# Patient Record
Sex: Female | Born: 1950 | Race: Black or African American | Hispanic: No | Smoking: Former smoker
Health system: Southern US, Community
[De-identification: ages and names within clinical notes are randomized; demographics above are authoritative.]

## PROBLEM LIST (undated history)

## (undated) DIAGNOSIS — R0981 Nasal congestion: Secondary | ICD-10-CM

## (undated) DIAGNOSIS — Z9889 Other specified postprocedural states: Secondary | ICD-10-CM

## (undated) DIAGNOSIS — F329 Major depressive disorder, single episode, unspecified: Secondary | ICD-10-CM

## (undated) DIAGNOSIS — R112 Nausea with vomiting, unspecified: Secondary | ICD-10-CM

## (undated) DIAGNOSIS — E119 Type 2 diabetes mellitus without complications: Secondary | ICD-10-CM

## (undated) DIAGNOSIS — F32A Depression, unspecified: Secondary | ICD-10-CM

## (undated) DIAGNOSIS — I10 Essential (primary) hypertension: Secondary | ICD-10-CM

## (undated) DIAGNOSIS — E785 Hyperlipidemia, unspecified: Secondary | ICD-10-CM

## (undated) DIAGNOSIS — F419 Anxiety disorder, unspecified: Secondary | ICD-10-CM

## (undated) HISTORY — PX: PILONIDAL CYST EXCISION: SHX744

---

## 1998-05-20 ENCOUNTER — Other Ambulatory Visit: Admission: RE | Admit: 1998-05-20 | Discharge: 1998-05-20 | Payer: Self-pay | Admitting: Internal Medicine

## 1999-10-15 ENCOUNTER — Observation Stay (HOSPITAL_COMMUNITY): Admission: EM | Admit: 1999-10-15 | Discharge: 1999-10-16 | Payer: Self-pay | Admitting: Emergency Medicine

## 1999-10-15 ENCOUNTER — Encounter: Payer: Self-pay | Admitting: Emergency Medicine

## 1999-10-28 ENCOUNTER — Encounter: Payer: Self-pay | Admitting: Emergency Medicine

## 1999-10-28 ENCOUNTER — Emergency Department (HOSPITAL_COMMUNITY): Admission: EM | Admit: 1999-10-28 | Discharge: 1999-10-28 | Payer: Self-pay | Admitting: Emergency Medicine

## 1999-11-21 ENCOUNTER — Encounter: Admission: RE | Admit: 1999-11-21 | Discharge: 2000-02-19 | Payer: Self-pay | Admitting: Internal Medicine

## 2000-02-14 ENCOUNTER — Encounter: Admission: RE | Admit: 2000-02-14 | Discharge: 2000-05-14 | Payer: Self-pay | Admitting: Internal Medicine

## 2000-06-21 ENCOUNTER — Other Ambulatory Visit: Admission: RE | Admit: 2000-06-21 | Discharge: 2000-06-21 | Payer: Self-pay | Admitting: Internal Medicine

## 2000-06-25 ENCOUNTER — Encounter: Admission: RE | Admit: 2000-06-25 | Discharge: 2000-06-25 | Payer: Self-pay | Admitting: Internal Medicine

## 2000-06-25 ENCOUNTER — Encounter: Payer: Self-pay | Admitting: Internal Medicine

## 2000-07-05 ENCOUNTER — Encounter: Payer: Self-pay | Admitting: Internal Medicine

## 2000-07-05 ENCOUNTER — Encounter: Admission: RE | Admit: 2000-07-05 | Discharge: 2000-07-05 | Payer: Self-pay | Admitting: Internal Medicine

## 2000-07-30 ENCOUNTER — Other Ambulatory Visit: Admission: RE | Admit: 2000-07-30 | Discharge: 2000-07-30 | Payer: Self-pay | Admitting: *Deleted

## 2000-07-30 ENCOUNTER — Encounter (INDEPENDENT_AMBULATORY_CARE_PROVIDER_SITE_OTHER): Payer: Self-pay | Admitting: Specialist

## 2001-07-08 ENCOUNTER — Encounter: Payer: Self-pay | Admitting: Internal Medicine

## 2001-07-08 ENCOUNTER — Encounter: Admission: RE | Admit: 2001-07-08 | Discharge: 2001-07-08 | Payer: Self-pay | Admitting: Internal Medicine

## 2001-12-12 ENCOUNTER — Emergency Department (HOSPITAL_COMMUNITY): Admission: EM | Admit: 2001-12-12 | Discharge: 2001-12-12 | Payer: Self-pay | Admitting: Emergency Medicine

## 2002-01-06 ENCOUNTER — Other Ambulatory Visit: Admission: RE | Admit: 2002-01-06 | Discharge: 2002-01-06 | Payer: Self-pay | Admitting: Internal Medicine

## 2002-06-24 ENCOUNTER — Encounter: Admission: RE | Admit: 2002-06-24 | Discharge: 2002-06-24 | Payer: Self-pay | Admitting: Internal Medicine

## 2002-06-24 ENCOUNTER — Encounter: Payer: Self-pay | Admitting: Internal Medicine

## 2003-03-17 ENCOUNTER — Other Ambulatory Visit: Admission: RE | Admit: 2003-03-17 | Discharge: 2003-03-17 | Payer: Self-pay | Admitting: *Deleted

## 2003-06-26 ENCOUNTER — Encounter: Payer: Self-pay | Admitting: Internal Medicine

## 2003-06-26 ENCOUNTER — Encounter: Admission: RE | Admit: 2003-06-26 | Discharge: 2003-06-26 | Payer: Self-pay | Admitting: Internal Medicine

## 2004-07-07 ENCOUNTER — Encounter: Admission: RE | Admit: 2004-07-07 | Discharge: 2004-07-07 | Payer: Self-pay | Admitting: Internal Medicine

## 2004-10-18 ENCOUNTER — Emergency Department (HOSPITAL_COMMUNITY): Admission: EM | Admit: 2004-10-18 | Discharge: 2004-10-18 | Payer: Self-pay | Admitting: Emergency Medicine

## 2005-02-08 ENCOUNTER — Encounter: Admission: RE | Admit: 2005-02-08 | Discharge: 2005-05-09 | Payer: Self-pay | Admitting: Urology

## 2005-07-10 ENCOUNTER — Encounter: Admission: RE | Admit: 2005-07-10 | Discharge: 2005-07-10 | Payer: Self-pay | Admitting: Internal Medicine

## 2006-10-31 ENCOUNTER — Encounter: Admission: RE | Admit: 2006-10-31 | Discharge: 2006-10-31 | Payer: Self-pay | Admitting: Internal Medicine

## 2006-12-28 ENCOUNTER — Emergency Department (HOSPITAL_COMMUNITY): Admission: EM | Admit: 2006-12-28 | Discharge: 2006-12-28 | Payer: Self-pay | Admitting: Emergency Medicine

## 2007-11-04 ENCOUNTER — Encounter: Admission: RE | Admit: 2007-11-04 | Discharge: 2007-11-04 | Payer: Self-pay | Admitting: Internal Medicine

## 2007-12-18 ENCOUNTER — Emergency Department (HOSPITAL_COMMUNITY): Admission: EM | Admit: 2007-12-18 | Discharge: 2007-12-18 | Payer: Self-pay | Admitting: Emergency Medicine

## 2008-09-24 ENCOUNTER — Ambulatory Visit (HOSPITAL_COMMUNITY): Admission: RE | Admit: 2008-09-24 | Discharge: 2008-09-24 | Payer: Self-pay | Admitting: Cardiology

## 2008-09-24 ENCOUNTER — Encounter (INDEPENDENT_AMBULATORY_CARE_PROVIDER_SITE_OTHER): Payer: Self-pay | Admitting: Cardiology

## 2008-09-25 ENCOUNTER — Ambulatory Visit (HOSPITAL_COMMUNITY): Admission: RE | Admit: 2008-09-25 | Discharge: 2008-09-25 | Payer: Self-pay | Admitting: Cardiology

## 2008-11-04 ENCOUNTER — Encounter: Admission: RE | Admit: 2008-11-04 | Discharge: 2008-11-04 | Payer: Self-pay | Admitting: Internal Medicine

## 2008-12-07 ENCOUNTER — Other Ambulatory Visit: Admission: RE | Admit: 2008-12-07 | Discharge: 2008-12-07 | Payer: Self-pay | Admitting: Obstetrics and Gynecology

## 2009-11-16 ENCOUNTER — Encounter: Admission: RE | Admit: 2009-11-16 | Discharge: 2009-11-16 | Payer: Self-pay | Admitting: Internal Medicine

## 2010-01-12 ENCOUNTER — Other Ambulatory Visit: Admission: RE | Admit: 2010-01-12 | Discharge: 2010-01-12 | Payer: Self-pay | Admitting: Obstetrics and Gynecology

## 2010-11-17 ENCOUNTER — Encounter: Admission: RE | Admit: 2010-11-17 | Discharge: 2010-11-17 | Payer: Self-pay | Admitting: Internal Medicine

## 2011-09-22 LAB — STREP A DNA PROBE: Group A Strep Probe: NEGATIVE

## 2011-09-22 LAB — RAPID STREP SCREEN (MED CTR MEBANE ONLY): Streptococcus, Group A Screen (Direct): NEGATIVE

## 2011-10-09 ENCOUNTER — Other Ambulatory Visit: Payer: Self-pay | Admitting: Internal Medicine

## 2011-10-09 DIAGNOSIS — Z1231 Encounter for screening mammogram for malignant neoplasm of breast: Secondary | ICD-10-CM

## 2011-11-20 ENCOUNTER — Ambulatory Visit: Payer: Self-pay

## 2011-12-20 ENCOUNTER — Ambulatory Visit: Payer: Self-pay

## 2012-01-09 ENCOUNTER — Ambulatory Visit
Admission: RE | Admit: 2012-01-09 | Discharge: 2012-01-09 | Disposition: A | Discharge status: Home / Self Care | Payer: Medicaid Other | Source: Ambulatory Visit | Attending: Internal Medicine | Admitting: Internal Medicine

## 2012-01-09 DIAGNOSIS — Z1231 Encounter for screening mammogram for malignant neoplasm of breast: Secondary | ICD-10-CM

## 2012-03-07 ENCOUNTER — Other Ambulatory Visit: Payer: Self-pay | Admitting: Nurse Practitioner

## 2012-03-07 ENCOUNTER — Other Ambulatory Visit (HOSPITAL_COMMUNITY)
Admission: RE | Admit: 2012-03-07 | Discharge: 2012-03-07 | Disposition: A | Discharge status: Home / Self Care | Payer: Medicaid Other | Source: Ambulatory Visit | Attending: Obstetrics and Gynecology | Admitting: Obstetrics and Gynecology

## 2012-03-07 DIAGNOSIS — Z01419 Encounter for gynecological examination (general) (routine) without abnormal findings: Secondary | ICD-10-CM | POA: Insufficient documentation

## 2012-03-07 DIAGNOSIS — Z1159 Encounter for screening for other viral diseases: Secondary | ICD-10-CM | POA: Insufficient documentation

## 2012-04-11 ENCOUNTER — Other Ambulatory Visit: Payer: Self-pay | Admitting: Nurse Practitioner

## 2012-05-01 ENCOUNTER — Other Ambulatory Visit: Payer: Self-pay | Admitting: Obstetrics and Gynecology

## 2012-12-26 ENCOUNTER — Other Ambulatory Visit: Payer: Self-pay | Admitting: Internal Medicine

## 2012-12-26 DIAGNOSIS — Z1231 Encounter for screening mammogram for malignant neoplasm of breast: Secondary | ICD-10-CM

## 2013-01-16 ENCOUNTER — Ambulatory Visit
Admission: RE | Admit: 2013-01-16 | Discharge: 2013-01-16 | Disposition: A | Discharge status: Home / Self Care | Payer: Medicaid Other | Source: Ambulatory Visit | Attending: Internal Medicine | Admitting: Internal Medicine

## 2013-01-16 DIAGNOSIS — Z1231 Encounter for screening mammogram for malignant neoplasm of breast: Secondary | ICD-10-CM

## 2013-09-30 ENCOUNTER — Other Ambulatory Visit: Payer: Self-pay | Admitting: Nurse Practitioner

## 2013-12-09 ENCOUNTER — Other Ambulatory Visit: Payer: Self-pay

## 2013-12-09 DIAGNOSIS — Z1231 Encounter for screening mammogram for malignant neoplasm of breast: Secondary | ICD-10-CM

## 2014-01-19 ENCOUNTER — Ambulatory Visit: Payer: Medicaid Other

## 2014-02-10 ENCOUNTER — Ambulatory Visit: Payer: Medicaid Other

## 2014-02-22 ENCOUNTER — Encounter (HOSPITAL_COMMUNITY): Payer: Self-pay | Admitting: Emergency Medicine

## 2014-02-22 ENCOUNTER — Emergency Department (HOSPITAL_COMMUNITY): Payer: Medicaid Other

## 2014-02-22 ENCOUNTER — Emergency Department (HOSPITAL_COMMUNITY)
Admission: EM | Admit: 2014-02-22 | Discharge: 2014-02-22 | Disposition: A | Discharge status: Home / Self Care | Payer: Medicaid Other | Attending: Emergency Medicine | Admitting: Emergency Medicine

## 2014-02-22 DIAGNOSIS — E119 Type 2 diabetes mellitus without complications: Secondary | ICD-10-CM | POA: Insufficient documentation

## 2014-02-22 DIAGNOSIS — Z87891 Personal history of nicotine dependence: Secondary | ICD-10-CM | POA: Insufficient documentation

## 2014-02-22 DIAGNOSIS — Y929 Unspecified place or not applicable: Secondary | ICD-10-CM | POA: Insufficient documentation

## 2014-02-22 DIAGNOSIS — S92919A Unspecified fracture of unspecified toe(s), initial encounter for closed fracture: Secondary | ICD-10-CM | POA: Insufficient documentation

## 2014-02-22 DIAGNOSIS — Y939 Activity, unspecified: Secondary | ICD-10-CM | POA: Insufficient documentation

## 2014-02-22 DIAGNOSIS — S92502A Displaced unspecified fracture of left lesser toe(s), initial encounter for closed fracture: Secondary | ICD-10-CM

## 2014-02-22 DIAGNOSIS — W2203XA Walked into furniture, initial encounter: Secondary | ICD-10-CM | POA: Insufficient documentation

## 2014-02-22 DIAGNOSIS — I10 Essential (primary) hypertension: Secondary | ICD-10-CM | POA: Insufficient documentation

## 2014-02-22 HISTORY — DX: Hyperlipidemia, unspecified: E78.5

## 2014-02-22 HISTORY — DX: Essential (primary) hypertension: I10

## 2014-02-22 HISTORY — DX: Type 2 diabetes mellitus without complications: E11.9

## 2014-02-22 MED ORDER — HYDROCODONE-ACETAMINOPHEN 5-325 MG PO TABS: 1.0000 | ORAL_TABLET | ORAL | Status: DC | PRN

## 2014-02-22 MED ORDER — ACETAMINOPHEN 325 MG PO TABS
650.0000 mg | ORAL_TABLET | Freq: Once | ORAL | Status: AC
  Administered 2014-02-22: 650 mg via ORAL
  Filled 2014-02-22: qty 2

## 2014-02-22 NOTE — ED Notes (Signed)
Pt reports she hit L pinky toes on bed and toe was stuck out to side. She stuck it back in shoe and felt it pop. Now how pain just to pinky toe. Pt ambulatory.

## 2014-02-22 NOTE — ED Provider Notes (Signed)
CSN: [id number]     Arrival date & time 02/22/14  1850 History  This chart was scribed for non-physician practitioner, Sherrie George, PA-C working with Babette Relic, MD by Frederich Balding, ED scribe. This patient was seen in room TR06C/TR06C and the patient's care was started at 7:46 PM.   Chief Complaint  Patient presents with  . Toe Injury   The history is provided by the patient. No language interpreter was used.   HPI Comments: [name] is a 63 y.o. female with history of diabetes who presents to the Emergency Department complaining of left pinky toe injury at occurred earlier today. She states she stubbed it on a wooden bed and noticed her toe was sticking out to the side. Denies hitting her head or LOC. Pt has sudden onset, aching pinky toe pain. She states she touched her toe and felt a pop in her toe. Pt states the pain started getting better after it popped. Palpation and bearing weight worsen the pain.   Past Medical History  Diagnosis Date  . Diabetes mellitus without complication   . Hypertension   . Hyperlipemia    History reviewed. No pertinent past surgical history. No family history on file. History  Substance Use Topics  . Smoking status: Former Research scientist (life sciences)  . Smokeless tobacco: Not on file  . Alcohol Use: No   OB History   Grav Para Term Preterm Abortions TAB SAB Ect Mult Living                 Review of Systems  All other systems reviewed and are negative.   Allergies  Review of patient's allergies indicates no known allergies.  Home Medications   Current Outpatient Rx  Name  Route  Sig  Dispense  Refill  . HYDROcodone-acetaminophen (NORCO) 5-325 MG per tablet   Oral   Take 1 tablet by mouth every 4 (four) hours as needed.   12 tablet   0     BP 151/60  Pulse 82  Temp(Src) 98.7 F (37.1 C) (Oral)  Resp 22  Ht 5\' 5"  (1.651 m)  Wt 229 lb (103.874 kg)  BMI 38.11 kg/m2  SpO2 95%  Physical Exam  Nursing note and vitals  reviewed. Constitutional: She is oriented to person, place, and time. She appears well-developed and well-nourished. No distress.  HENT:  Head: Normocephalic and atraumatic.  Eyes: Conjunctivae are normal.  Neck: No tracheal deviation present.  Pulmonary/Chest: Effort normal. No respiratory distress.  Musculoskeletal: Normal range of motion. She exhibits no edema.  Tender to resisted plantar flexion of left pinky toes. No swelling. No bruising. Pt able to ambulate in room. Antalgic gait. Pt states did not bother her too bad. No ulceration, abrasions, lacerations or obvious injuries.   Neurological: She is alert and oriented to person, place, and time.  Skin: Skin is warm and dry. She is not diaphoretic.  Psychiatric: She has a normal mood and affect. Her behavior is normal.    ED Course  Procedures (including critical care time)  DIAGNOSTIC STUDIES: Oxygen Saturation is 95% on RA, adequate by my interpretation.    COORDINATION OF CARE: 7:49 PM-Discussed treatment plan which includes xray with pt at bedside and pt agreed to plan.   Labs Review Labs Reviewed - No data to display Imaging Review No results found.   EKG Interpretation None      MDM   Final diagnoses:  Fracture of fifth toe, left, closed   Plain films show  Oblique fracture of the midshaft of the proximal phalanx of the left fifth toe as described. Toe was buddy taped. Patient pain treated. Plan to have patient follow up with her PCP in 2 days and monitor for any signs of infection or worsening pain to affected toe. Recommend hard soled shoes. Patient agrees with plan. Discharged in good condition.     Meds given in ED:  Medications  acetaminophen (TYLENOL) tablet 650 mg (650 mg Oral Given 02/22/14 2001)    There are no discharge medications for this patient.  I personally performed the services described in this documentation, which was scribed in my presence. The recorded information has been reviewed and  is accurate.   Sherrie George, PA-C 03/04/14 2206

## 2014-02-22 NOTE — Discharge Instructions (Signed)
Follow up with your doctor in 2 days. Take pain medication as directed. Do not drive with pain medication. Return to ED if your symptoms worsen.    Buddy Taping of Toes We have taped your toes together to keep them from moving. This is called "buddy taping" since we used a part of your own body to keep the injured part still. We placed soft padding between your toes to keep them from rubbing against each other. Buddy taping will help with healing and to reduce pain. Keep your toes buddy taped together for as long as directed by your caregiver. HOME CARE INSTRUCTIONS   Raise your injured area above the level of your heart while sitting or lying down. Prop it up with pillows.  An ice pack used every twenty minutes, while awake, for the first one to two days may be helpful. Put ice in a plastic bag and put a towel between the bag and your skin.  Watch for signs that the taping is too tight. These signs may be:  Numbness of your taped toes.  Coolness of your taped toes.  Color change in the area beyond the tape.  Increased pain.  If you have any of these signs, loosen or rewrap the tape. If you need to loosen or rewrap the buddy tape, make sure you use the padding again. SEEK IMMEDIATE MEDICAL CARE IF:   You have worse pain, swelling, inflammation (soreness), drainage or bleeding after you rewrap the tape.  Any new problems occur. MAKE SURE YOU:   Understand these instructions.  Will watch your condition.  Will get help right away if you are not doing well or get worse. Document Released: 09/07/2004 Document Revised: 02/26/2012 Document Reviewed: 12/01/2008 [facility] Patient Information 2014 [place].

## 2014-02-23 NOTE — ED Provider Notes (Signed)
Medical screening examination/treatment/procedure(s) were performed by non-physician practitioner and as supervising physician I was immediately available for consultation/collaboration.   EKG Interpretation None       Babette Relic, MD 02/23/14 [phone]

## 2014-02-27 ENCOUNTER — Ambulatory Visit
Admission: RE | Admit: 2014-02-27 | Discharge: 2014-02-27 | Disposition: A | Discharge status: Home / Self Care | Payer: Medicaid Other | Source: Ambulatory Visit

## 2014-02-27 DIAGNOSIS — Z1231 Encounter for screening mammogram for malignant neoplasm of breast: Secondary | ICD-10-CM

## 2014-03-06 NOTE — ED Provider Notes (Signed)
Medical screening examination/treatment/procedure(s) were performed by non-physician practitioner and as supervising physician I was immediately available for consultation/collaboration.   EKG Interpretation None       Babette Relic, MD 03/06/14 [phone]

## 2014-12-01 ENCOUNTER — Other Ambulatory Visit: Payer: Self-pay | Admitting: Nurse Practitioner

## 2014-12-01 ENCOUNTER — Other Ambulatory Visit (HOSPITAL_COMMUNITY)
Admission: RE | Admit: 2014-12-01 | Discharge: 2014-12-01 | Disposition: A | Discharge status: Home / Self Care | Payer: Medicare Other | Source: Ambulatory Visit | Attending: Obstetrics and Gynecology | Admitting: Obstetrics and Gynecology

## 2014-12-01 DIAGNOSIS — I10 Essential (primary) hypertension: Secondary | ICD-10-CM | POA: Diagnosis not present

## 2014-12-01 DIAGNOSIS — N858 Other specified noninflammatory disorders of uterus: Secondary | ICD-10-CM | POA: Diagnosis not present

## 2014-12-01 DIAGNOSIS — Z23 Encounter for immunization: Secondary | ICD-10-CM | POA: Diagnosis not present

## 2014-12-01 DIAGNOSIS — R938 Abnormal findings on diagnostic imaging of other specified body structures: Secondary | ICD-10-CM | POA: Diagnosis not present

## 2014-12-01 DIAGNOSIS — E119 Type 2 diabetes mellitus without complications: Secondary | ICD-10-CM | POA: Diagnosis not present

## 2014-12-01 DIAGNOSIS — Z01419 Encounter for gynecological examination (general) (routine) without abnormal findings: Secondary | ICD-10-CM | POA: Diagnosis not present

## 2014-12-01 DIAGNOSIS — Z124 Encounter for screening for malignant neoplasm of cervix: Secondary | ICD-10-CM | POA: Diagnosis not present

## 2014-12-03 LAB — CYTOLOGY - PAP

## 2015-03-01 ENCOUNTER — Other Ambulatory Visit: Payer: Self-pay

## 2015-03-01 ENCOUNTER — Ambulatory Visit
Admission: RE | Admit: 2015-03-01 | Discharge: 2015-03-01 | Disposition: A | Discharge status: Home / Self Care | Payer: Medicare Other | Source: Ambulatory Visit

## 2015-03-01 DIAGNOSIS — Z1231 Encounter for screening mammogram for malignant neoplasm of breast: Secondary | ICD-10-CM | POA: Diagnosis not present

## 2015-04-06 DIAGNOSIS — E119 Type 2 diabetes mellitus without complications: Secondary | ICD-10-CM | POA: Diagnosis not present

## 2015-04-06 DIAGNOSIS — F324 Major depressive disorder, single episode, in partial remission: Secondary | ICD-10-CM | POA: Diagnosis not present

## 2015-04-06 DIAGNOSIS — I10 Essential (primary) hypertension: Secondary | ICD-10-CM | POA: Diagnosis not present

## 2015-05-03 DIAGNOSIS — R51 Headache: Secondary | ICD-10-CM | POA: Diagnosis not present

## 2015-05-03 DIAGNOSIS — F419 Anxiety disorder, unspecified: Secondary | ICD-10-CM | POA: Diagnosis not present

## 2015-05-28 DIAGNOSIS — E119 Type 2 diabetes mellitus without complications: Secondary | ICD-10-CM | POA: Diagnosis not present

## 2015-05-28 DIAGNOSIS — H4011X2 Primary open-angle glaucoma, moderate stage: Secondary | ICD-10-CM | POA: Diagnosis not present

## 2015-05-28 DIAGNOSIS — F324 Major depressive disorder, single episode, in partial remission: Secondary | ICD-10-CM | POA: Diagnosis not present

## 2015-06-29 DIAGNOSIS — R938 Abnormal findings on diagnostic imaging of other specified body structures: Secondary | ICD-10-CM | POA: Diagnosis not present

## 2015-06-29 DIAGNOSIS — D259 Leiomyoma of uterus, unspecified: Secondary | ICD-10-CM | POA: Diagnosis not present

## 2015-07-21 DIAGNOSIS — D259 Leiomyoma of uterus, unspecified: Secondary | ICD-10-CM | POA: Diagnosis not present

## 2015-07-21 DIAGNOSIS — N9489 Other specified conditions associated with female genital organs and menstrual cycle: Secondary | ICD-10-CM | POA: Diagnosis not present

## 2015-07-21 DIAGNOSIS — R938 Abnormal findings on diagnostic imaging of other specified body structures: Secondary | ICD-10-CM | POA: Diagnosis not present

## 2015-08-24 ENCOUNTER — Other Ambulatory Visit: Payer: Self-pay | Admitting: Obstetrics and Gynecology

## 2015-08-24 NOTE — H&P (Signed)
Reason for Appointment  1. Endometrial Mass    History of Present Illness  General:  64 y/o presents for a surgery consult. she is referred by our NP Kess thongteum. she has a h/o uterine fibroids. she had an ultrasound on 06/29/2015 that showed multiple uterine fibroids. the largest is 5.1 cm... Her uterus measures 10.5 cm x 6.0 cm x 7.6 cm . of note on her ultrasound the endometrium is thickened with hyperechoic mass 2.0 cm. it is avascular. Her right ovary has a simiple cyst 2.4 cm which is avascular . her left ovary is within normal. she denies postmenopausal bleeding.  An endometrial biopsy was attempted in the office by NP Kess Thongteum. this ws unsuccessful due to fibroids.    Current Medications  Taking   Aspirin 81 MG Tablet 1 tablet Once a day   Travatan Z(Travoprost) 0.004 % Solution 1 drop into affected eye every evening Once a day   Vitamin D 1000 UNIT Capsule 1 capsule Once a day   Calcium + D3 600-200 MG-UNIT Tablet   Simvastatin 20 MG Tablet take 1 tablet by mouth once daily   Januvia(SitaGLIPtin Phosphate) 100 MG Tablet 1 tablet Once a day   MetFORMIN HCl ER 500 MG Tablet Extended Release 24 Hour TAKE 4 TABLETS BY MOUTH EVERY MORNING   Glimepiride 4 MG Tablet 1/2 tablet with breakfast or the first main meal of the day Once a day   Pioglitazone HCl 30 MG Tablet take 1 tablet by mouth once daily   Lorazepam 1 MG Tablet take 1 tablet by mouth at bedtime   Fluoxetine HCl 20 MG Capsule 1 tablet in the morning Once a day   Aldactone(Spironolactone) 25 MG Tablet 1 tablet Once a day   Not-Taking/PRN   Lisinopril-Hydrochlorothiazide 20-25 MG Tablet 1 tablet Once a day   Amlodipine Besylate 10 MG Tablet take 1 tablet by mouth once daily   Accu-Chek Aviva Plus(Blood Glucose Test) w/Device Kit use as directed   Accu-Chek Aviva Test Strips test strips use as directed once daily and if needed   Medication List reviewed and reconciled with the patient    Past Medical History  type  II diabetes  Hypertension  Obesity  Anxiety/depression  Left adnexal cyst, resolved/fibroid uterus  Glaucoma  +PPD, December 2009, INH 9 months  History of goiter  Osteopenia  Hypercalcemia   Surgical History  pilonodal cyst removal 1970   Family History  Father: deceased, Unknown  Mother: deceased 9 yrs, Hypertension, diagnosed with HTN  Paternal Grand Father: deceased  Paternal Grand Mother: deceased  Maternal Grand Father: deceased  Maternal Grand Mother: deceased  Brother 1: deceased, Alcoholism  Brother2: deceased, alcoholism  Brother 3: deceased, alcholism  Sister 1: alive, Hypertension, alcoholism,?cancer, diagnosed with HTN  Sister 2: alive  father had diabetes and hypertension in mother had palpitations but no early family history of coronary artery disease., denies any GYN family cancer hx.   Social History  General:  Tobacco use cigarettes: Former smoker, Quit in year 1980, Pack-year Hx: 10, Tobacco history last updated 07/21/2015, Additional Findings: Tobacco Non-User Ex-moderate cigarette smoker (10-19/day). no Smoking, quit. no Alcohol, no. Caffeine: yes, soda, occasionally. no Recreational drug use. Exercise: yes, hip hop tapes. Occupation: Intermittent housecleaning. Marital Status: Divorced 4 times. Children: no children offer him,1 adopted daughter. Seat belt use: yes. Divorced: yes.    Gyn History  Sexual activity not currently sexually active.  Periods : postmenopausal.  Last pap smear date 12.15.15 - WNL.  Last  mammogram date 3.14.16.  Denies H/O Abnormal pap smear.  Denies H/O STD.    OB History  miscarriages 2.  Pregnancy # 1 miscarriage.  Pregnancy # 2 miscarriage.    Allergies  Sertraline HCl: not effective  Fluoxetine: gittery, weird, sick    Hospitalization/Major Diagnostic Procedure  abscess cyst on buttocks    Review of Systems  CONSTITUTIONAL:  Fatigue none. Fever none today.  SKIN:  Rash no. Suspicious lesions no.  CARDIOLOGY:   Chest pain none.  RESPIRATORY:  Shortness of breath no. Cough no.  GASTROENTEROLOGY:  Appetite change none. Change in bowel habits no.  FEMALE REPRODUCTIVE:  Breast lumps or discharge no. Breast pain none. Dyspareunia none. Dysuria no. Pelvic pain none. Regular menses yes. Unusual vaginal discharge no. Vaginal itching no. Vulvar/labial lesion no.  NEUROLOGY:  Migraines none. Tingling/numbness none. Visual changes none.  PSYCHOLOGY:  Depression no.  ENDOCRINOLOGY:  Hot flashes none. Weight gain no unintentional. Weight loss none.  HEMATOLOGY/LYMPH:  Anemia no.              Vital Signs  Wt 226, Wt change 1.4 lb, Pulse sitting 94, BP sitting 154/80.   Physical Examination  GENERAL:  Patient appears in NAD, pleasant. Build: well developed. General Appearance: overweight. Race: african-american.  LUNGS:  Breath sounds: clear to auscultation. Dyspnea: no.  HEART:  Murmurs: systolic murmur. grade 2/6. Rate: normal. Rhythm: regular.  ABDOMEN:  General: no masses,tenderness,organomegaly, no CVAT.  FEMALE GENITOURINARY:  Adnexa: no mass, non tender. Anus/perineum: normal, no lesions. Cervix/ cuff: normal appearance , no lesions/discharge/bleeding, , good pelvic support , external os normal . External genitalia: normal, no lesions, no skin discoloration, no lymphadenopathy. Rectum: deferred. Urethra: normal external meatus. Uterus: normal size/shape/consistency, freely mobile, non tender. Vagina: pink/moist mucosa, no lesions, no abnormal discharge, odorless. Vulva: normal, no lesions, no skin discoloration, non tender.  EXTREMITIES:  Extremities FROM of all extremities.  NEUROLOGICAL:  Gait: normal. Orientation: alert and oriented x 3.     Assessments   1. Endometrial mass - N94.89 (Primary)   2. Fibroids - D25.9   3. Thickened endometrium - R93.8   Treatment  1. Endometrial mass  Notes: given inability to sample adequately in the office recommend hysteroscopy D&C with  removal of endometrial mass using myosure. ... R/B/A of surgery were discussed with the patient including but not limited to infection/ bleeding/ possible perforation of the uterus with the need for further surgery . r/o transfusion was discussed. pt desires to proceed with surgery.

## 2015-08-25 ENCOUNTER — Encounter (HOSPITAL_COMMUNITY): Payer: Self-pay

## 2015-08-25 ENCOUNTER — Encounter (HOSPITAL_COMMUNITY)
Admission: RE | Admit: 2015-08-25 | Discharge: 2015-08-25 | Disposition: A | Discharge status: Home / Self Care | Payer: Medicare Other | Source: Ambulatory Visit | Attending: Obstetrics and Gynecology | Admitting: Obstetrics and Gynecology

## 2015-08-25 ENCOUNTER — Other Ambulatory Visit: Payer: Self-pay

## 2015-08-25 DIAGNOSIS — F419 Anxiety disorder, unspecified: Secondary | ICD-10-CM | POA: Diagnosis not present

## 2015-08-25 DIAGNOSIS — E119 Type 2 diabetes mellitus without complications: Secondary | ICD-10-CM | POA: Diagnosis not present

## 2015-08-25 DIAGNOSIS — N840 Polyp of corpus uteri: Secondary | ICD-10-CM | POA: Diagnosis not present

## 2015-08-25 DIAGNOSIS — F329 Major depressive disorder, single episode, unspecified: Secondary | ICD-10-CM | POA: Diagnosis not present

## 2015-08-25 DIAGNOSIS — I10 Essential (primary) hypertension: Secondary | ICD-10-CM | POA: Diagnosis not present

## 2015-08-25 DIAGNOSIS — R0981 Nasal congestion: Secondary | ICD-10-CM

## 2015-08-25 DIAGNOSIS — Z87891 Personal history of nicotine dependence: Secondary | ICD-10-CM | POA: Diagnosis not present

## 2015-08-25 DIAGNOSIS — N9489 Other specified conditions associated with female genital organs and menstrual cycle: Secondary | ICD-10-CM | POA: Diagnosis present

## 2015-08-25 DIAGNOSIS — R011 Cardiac murmur, unspecified: Secondary | ICD-10-CM | POA: Diagnosis not present

## 2015-08-25 HISTORY — DX: Other specified postprocedural states: Z98.890

## 2015-08-25 HISTORY — DX: Anxiety disorder, unspecified: F41.9

## 2015-08-25 HISTORY — DX: Nasal congestion: R09.81

## 2015-08-25 HISTORY — DX: Other specified postprocedural states: R11.2

## 2015-08-25 HISTORY — DX: Depression, unspecified: F32.A

## 2015-08-25 HISTORY — DX: Major depressive disorder, single episode, unspecified: F32.9

## 2015-08-25 LAB — CBC
HCT: 41.7 % (ref 36.0–46.0)
Hemoglobin: 13.8 g/dL (ref 12.0–15.0)
MCH: 28.3 pg (ref 26.0–34.0)
MCHC: 33.1 g/dL (ref 30.0–36.0)
MCV: 85.5 fL (ref 78.0–100.0)
Platelets: 234 10*3/uL (ref 150–400)
RBC: 4.88 MIL/uL (ref 3.87–5.11)
RDW: 15 % (ref 11.5–15.5)
WBC: 7.7 10*3/uL (ref 4.0–10.5)

## 2015-08-25 LAB — BASIC METABOLIC PANEL
Anion gap: 9 (ref 5–15)
BUN: 8 mg/dL (ref 6–20)
CHLORIDE: 101 mmol/L (ref 101–111)
CO2: 28 mmol/L (ref 22–32)
Calcium: 9.4 mg/dL (ref 8.9–10.3)
Creatinine, Ser: 0.68 mg/dL (ref 0.44–1.00)
GFR calc Af Amer: >60 mL/min (ref >60)
GFR calc non Af Amer: >60 mL/min (ref >60)
GLUCOSE: 162 mg/dL — AB (ref 65–99)
POTASSIUM: 3.8 mmol/L (ref 3.5–5.1)
Sodium: 138 mmol/L (ref 135–145)

## 2015-08-25 NOTE — Patient Instructions (Addendum)
Your procedure is scheduled on:08/26/15 WEDNESDAY  Enter through the Main Entrance at :9am Pick up desk phone and dial [phone] and inform us of your arrival.  Please call [phone] if you have any problems the morning of surgery.  Remember: Do not eat food or drink liquids after midnight:tonight Water is  ok until:6:30 am   You may brush your teeth the morning of surgery.  Take these meds the morning of surgery with water:LORAZEPAM, FLUOXETINE,SPIRONLACTONE, LISINOPRIL-HYDROCHLOROTHIAZIDE, AMLODOPINE  DO NOT wear jewelry, eye make-up, lipstick,body lotion, or dark fingernail polish.  (Polished toes are ok) You may wear deodorant.  If you are to be admitted after surgery, leave suitcase in car until your room has been assigned. Patients discharged on the day of surgery will not be allowed to drive home. Wear loose fitting, comfortable clothes for your ride home.

## 2015-08-26 ENCOUNTER — Ambulatory Visit (HOSPITAL_COMMUNITY): Payer: Medicare Other | Admitting: Anesthesiology

## 2015-08-26 ENCOUNTER — Encounter (HOSPITAL_COMMUNITY): Payer: Self-pay | Admitting: Certified Registered Nurse Anesthetist

## 2015-08-26 ENCOUNTER — Ambulatory Visit (HOSPITAL_COMMUNITY)
Admission: RE | Admit: 2015-08-26 | Discharge: 2015-08-26 | Disposition: A | Discharge status: Home / Self Care | Payer: Medicare Other | Source: Ambulatory Visit | Attending: Obstetrics and Gynecology | Admitting: Obstetrics and Gynecology

## 2015-08-26 ENCOUNTER — Encounter (HOSPITAL_COMMUNITY)
Admission: RE | Disposition: A | Discharge status: Home / Self Care | Payer: Self-pay | Source: Ambulatory Visit | Attending: Obstetrics and Gynecology

## 2015-08-26 DIAGNOSIS — R011 Cardiac murmur, unspecified: Secondary | ICD-10-CM | POA: Diagnosis not present

## 2015-08-26 DIAGNOSIS — F329 Major depressive disorder, single episode, unspecified: Secondary | ICD-10-CM | POA: Diagnosis not present

## 2015-08-26 DIAGNOSIS — N9489 Other specified conditions associated with female genital organs and menstrual cycle: Secondary | ICD-10-CM | POA: Diagnosis not present

## 2015-08-26 DIAGNOSIS — I10 Essential (primary) hypertension: Secondary | ICD-10-CM | POA: Diagnosis not present

## 2015-08-26 DIAGNOSIS — N840 Polyp of corpus uteri: Secondary | ICD-10-CM | POA: Insufficient documentation

## 2015-08-26 DIAGNOSIS — F419 Anxiety disorder, unspecified: Secondary | ICD-10-CM | POA: Diagnosis not present

## 2015-08-26 DIAGNOSIS — E119 Type 2 diabetes mellitus without complications: Secondary | ICD-10-CM | POA: Diagnosis not present

## 2015-08-26 DIAGNOSIS — Z87891 Personal history of nicotine dependence: Secondary | ICD-10-CM | POA: Insufficient documentation

## 2015-08-26 HISTORY — PX: DILATATION & CURETTAGE/HYSTEROSCOPY WITH MYOSURE: SHX6511

## 2015-08-26 LAB — GLUCOSE, CAPILLARY: Glucose-Capillary: 147 mg/dL — ABNORMAL HIGH (ref 65–99)

## 2015-08-26 SURGERY — DILATATION & CURETTAGE/HYSTEROSCOPY WITH MYOSURE
Anesthesia: General

## 2015-08-26 MED ORDER — IBUPROFEN 600 MG PO TABS: 600.0000 mg | ORAL_TABLET | Freq: Four times a day (QID) | ORAL | Status: DC | PRN

## 2015-08-26 MED ORDER — SCOPOLAMINE 1 MG/3DAYS TD PT72
MEDICATED_PATCH | TRANSDERMAL | Status: AC
  Filled 2015-08-26: qty 1

## 2015-08-26 MED ORDER — BUPIVACAINE HCL (PF) 0.25 % IJ SOLN
INTRAMUSCULAR | Status: DC | PRN
  Administered 2015-08-26: 10 mL

## 2015-08-26 MED ORDER — LACTATED RINGERS IV SOLN: INTRAVENOUS | Status: DC

## 2015-08-26 MED ORDER — ONDANSETRON HCL 4 MG/2ML IJ SOLN
INTRAMUSCULAR | Status: AC
  Filled 2015-08-26: qty 2

## 2015-08-26 MED ORDER — ONDANSETRON HCL 4 MG/2ML IJ SOLN
INTRAMUSCULAR | Status: DC | PRN
  Administered 2015-08-26: 4 mg via INTRAVENOUS

## 2015-08-26 MED ORDER — FENTANYL CITRATE (PF) 100 MCG/2ML IJ SOLN
INTRAMUSCULAR | Status: DC | PRN
  Administered 2015-08-26 (×2): 50 ug via INTRAVENOUS

## 2015-08-26 MED ORDER — BUPIVACAINE HCL (PF) 0.25 % IJ SOLN
INTRAMUSCULAR | Status: AC
  Filled 2015-08-26: qty 30

## 2015-08-26 MED ORDER — SCOPOLAMINE 1 MG/3DAYS TD PT72
1.0000 | MEDICATED_PATCH | TRANSDERMAL | Status: DC
  Administered 2015-08-26: 1.5 mg via TRANSDERMAL

## 2015-08-26 MED ORDER — LIDOCAINE HCL (CARDIAC) 20 MG/ML IV SOLN
INTRAVENOUS | Status: DC | PRN
  Administered 2015-08-26: 40 mg via INTRAVENOUS

## 2015-08-26 MED ORDER — PROPOFOL 10 MG/ML IV BOLUS
INTRAVENOUS | Status: DC | PRN
  Administered 2015-08-26: 30 mg via INTRAVENOUS
  Administered 2015-08-26: 120 mg via INTRAVENOUS
  Administered 2015-08-26: 50 mg via INTRAVENOUS

## 2015-08-26 MED ORDER — LACTATED RINGERS IV SOLN
INTRAVENOUS | Status: DC
  Administered 2015-08-26: 10:00:00 via INTRAVENOUS

## 2015-08-26 MED ORDER — MINOCYCLINE HCL 100 MG PO CAPS
100.0000 mg | ORAL_CAPSULE | Freq: Every day | ORAL | Status: DC
Start: 2015-08-26 — End: 2016-09-03

## 2015-08-26 MED ORDER — LIDOCAINE HCL (CARDIAC) 20 MG/ML IV SOLN
INTRAVENOUS | Status: AC
  Filled 2015-08-26: qty 5

## 2015-08-26 MED ORDER — SILVER NITRATE-POT NITRATE 75-25 % EX MISC
CUTANEOUS | Status: DC | PRN
  Administered 2015-08-26: 2

## 2015-08-26 MED ORDER — DEXAMETHASONE SODIUM PHOSPHATE 10 MG/ML IJ SOLN
INTRAMUSCULAR | Status: DC | PRN
  Administered 2015-08-26: 4 mg via INTRAVENOUS

## 2015-08-26 MED ORDER — FENTANYL CITRATE (PF) 100 MCG/2ML IJ SOLN
INTRAMUSCULAR | Status: AC
  Filled 2015-08-26: qty 4

## 2015-08-26 MED ORDER — MEPERIDINE HCL 25 MG/ML IJ SOLN: 6.2500 mg | INTRAMUSCULAR | Status: DC | PRN

## 2015-08-26 MED ORDER — PROMETHAZINE HCL 25 MG/ML IJ SOLN
6.2500 mg | INTRAMUSCULAR | Status: DC | PRN
Start: 2015-08-26 — End: 2015-08-26

## 2015-08-26 MED ORDER — PROPOFOL 10 MG/ML IV BOLUS
INTRAVENOUS | Status: AC
  Filled 2015-08-26: qty 20

## 2015-08-26 MED ORDER — DEXAMETHASONE SODIUM PHOSPHATE 4 MG/ML IJ SOLN
INTRAMUSCULAR | Status: AC
  Filled 2015-08-26: qty 1

## 2015-08-26 MED ORDER — HYDROMORPHONE HCL 1 MG/ML IJ SOLN: 0.2500 mg | INTRAMUSCULAR | Status: DC | PRN

## 2015-08-26 MED ORDER — SODIUM CHLORIDE 0.9 % IR SOLN
Status: DC | PRN
  Administered 2015-08-26: 3000 mL

## 2015-08-26 SURGICAL SUPPLY — 22 items
CANISTER SUCT 3000ML (MISCELLANEOUS) ×3 IMPLANT
CATH ROBINSON RED A/P 16FR (CATHETERS) ×3 IMPLANT
CLOTH BEACON ORANGE TIMEOUT ST (SAFETY) ×3 IMPLANT
CONTAINER PREFILL 10% NBF 60ML (FORM) ×6 IMPLANT
DEVICE MYOSURE CLASSIC (MISCELLANEOUS) ×3 IMPLANT
DEVICE MYOSURE LITE (MISCELLANEOUS) IMPLANT
ELECT REM PT RETURN 9FT ADLT (ELECTROSURGICAL) ×3
ELECTRODE REM PT RTRN 9FT ADLT (ELECTROSURGICAL) ×1 IMPLANT
FILTER ARTHROSCOPY CONVERTOR (FILTER) ×3 IMPLANT
GLOVE BIOGEL M 6.5 STRL (GLOVE) ×6 IMPLANT
GLOVE BIOGEL PI IND STRL 6.5 (GLOVE) ×1 IMPLANT
GLOVE BIOGEL PI INDICATOR 6.5 (GLOVE) ×2
GOWN STRL REUS W/TWL LRG LVL3 (GOWN DISPOSABLE) ×6 IMPLANT
MYOSURE XL FIBROID REM (MISCELLANEOUS)
PACK VAGINAL MINOR WOMEN LF (CUSTOM PROCEDURE TRAY) ×3 IMPLANT
PAD OB MATERNITY 4.3X12.25 (PERSONAL CARE ITEMS) ×3 IMPLANT
SEAL ROD LENS SCOPE MYOSURE (ABLATOR) ×3 IMPLANT
SYSTEM TISS REMOVAL MYSR XL RM (MISCELLANEOUS) IMPLANT
TOWEL OR 17X24 6PK STRL BLUE (TOWEL DISPOSABLE) ×6 IMPLANT
TUBING AQUILEX INFLOW (TUBING) ×3 IMPLANT
TUBING AQUILEX OUTFLOW (TUBING) ×3 IMPLANT
WATER STERILE IRR 1000ML POUR (IV SOLUTION) ×3 IMPLANT

## 2015-08-26 NOTE — Transfer of Care (Signed)
Immediate Anesthesia Transfer of Care Note  Patient: Traci Morales  Procedure(s) Performed: Procedure(s): DILATATION & CURETTAGE/HYSTEROSCOPY, REMOVAL OF ENDOMETRIAL MASS WITH MYOSURE (N/A)  Patient Location: PACU  Anesthesia Type:General  Level of Consciousness: awake, alert , oriented and patient cooperative  Airway & Oxygen Therapy: Patient Spontanous Breathing and Patient connected to nasal cannula oxygen  Post-op Assessment: Report given to RN and Post -op Vital signs reviewed and stable  Post vital signs: Reviewed and stable  Last Vitals:  Filed Vitals:   08/26/15 0905  BP: 129/68  Pulse: 78  Temp: 37 C  Resp: 16    Complications: No apparent anesthesia complications

## 2015-08-26 NOTE — H&P (Signed)
Date of Initial H&P:08/24/2015  History reviewed, patient examined, no change in status, stable for surgery.

## 2015-08-26 NOTE — Op Note (Signed)
08/26/2015  11:31 AM  PATIENT:  Traci Morales  64 y.o. female  PRE-OPERATIVE DIAGNOSIS:  N94.89  Endometrial Mass  POST-OPERATIVE DIAGNOSIS:  N94.89  Endometrial Mass  PROCEDURE:  Procedure(s): DILATATION & CURETTAGE/HYSTEROSCOPY, REMOVAL OF ENDOMETRIAL MASS WITH MYOSURE (N/A)  SURGEON:  Surgeon(s) and Role:    * Christophe Louis, MD - Primary  PHYSICIAN ASSISTANT:   ASSISTANTS: none   ANESTHESIA:   general  EBL:  Total I/O In: 900 [I.V.:900] Out: 60 [Urine:50; Blood:5]  BLOOD ADMINISTERED:none  DRAINS: none   LOCAL MEDICATIONS USED:  MARCAINE     SPECIMEN:  Source of Specimen:  endometrial polyp and currettings  DISPOSITION OF SPECIMEN:  PATHOLOGY  COUNTS:  YES  TOURNIQUET:  * No tourniquets in log *  DICTATION: .Dragon Dictation  PLAN OF CARE: Discharge to home after PACU  PATIENT DISPOSITION:  PACU - hemodynamically stable.   Delay start of Pharmacological VTE agent (>24hrs) due to surgical blood loss or risk of bleeding: not applicable    Findings: endometrial polyp   Procedure: Patient was taken to the operating room where she was placed under general anesthesia. She was placed in the dorsal lithotomy position. She was prepped and draped in the usual sterile fashion. A speculum was placed into the vaginal vault. The anterior lip of the cervix was grasped with a single-tooth tenaculum. Quarter percent Marcaine was injected at the 4 and 8:00 positions of the cervix. The cervix was then sounded to 8 cm. The cervix was dilated to approximately 6 mm. Myosure  hysteroscope was inserted. The findings noted above.  The polyp was resected with Myosure .  A Sharp curet was introduced and endometrial curettings were obtained. The hysteroscope was then reinserted. There was no evidence of endometrial polyps or masses with reinsertion of the hysteroscope. There was no evidence of perforation. Hysteroscope was then removed. The single-tooth tenaculum was removed from the anterior  lip of the cervix.  Bleeding was noted from the tenaculum site.. Hemostasis was obtained with silver nitrate.  The speculum was removed from the patient's vagina. She was awakened from anesthesia taken care to the recovery room awake and in stable condition. Sponge lap and needle counts were correct x2.  Normal saline deficit was 175 cc.

## 2015-08-26 NOTE — Anesthesia Procedure Notes (Signed)
Procedure Name: LMA Insertion Date/Time: 08/26/2015 10:52 AM Performed by: Raenette Rover Pre-anesthesia Checklist: Patient identified, Patient being monitored, Emergency Drugs available and Suction available Patient Re-evaluated:Patient Re-evaluated prior to inductionOxygen Delivery Method: Circle system utilized Preoxygenation: Pre-oxygenation with 100% oxygen Intubation Type: IV induction Ventilation: Mask ventilation without difficulty LMA: LMA inserted LMA Size: 4.0 Number of attempts: 2 Placement Confirmation: breath sounds checked- equal and bilateral,  positive ETCO2 and CO2 detector Tube secured with: Tape Dental Injury: Teeth and Oropharynx as per pre-operative assessment

## 2015-08-26 NOTE — Anesthesia Preprocedure Evaluation (Addendum)
Anesthesia Evaluation  Patient identified by MRN, date of birth, ID band Patient awake    Reviewed: Allergy & Precautions, NPO status , Patient's Chart, lab work & pertinent test results  History of Anesthesia Complications (+) PONV  Airway Mallampati: II  TM Distance: >3 FB Neck ROM: Full    Dental  (+) Partial Upper,    Pulmonary former smoker,    breath sounds clear to auscultation       Cardiovascular hypertension, Pt. on medications  Rhythm:Regular Rate:Normal + Systolic murmurs- Carotid Bruit    Neuro/Psych PSYCHIATRIC DISORDERS Anxiety Depression    GI/Hepatic negative GI ROS, Neg liver ROS,   Endo/Other  negative endocrine ROSdiabetes, Type 2, Oral Hypoglycemic Agents  Renal/GU      Musculoskeletal negative musculoskeletal ROS (+)   Abdominal   Peds negative pediatric ROS (+)  Hematology negative hematology ROS (+)   Anesthesia Other Findings   Reproductive/Obstetrics negative OB ROS                            Lab Results  Component Value Date   WBC 7.7 08/25/2015   HGB 13.8 08/25/2015   HCT 41.7 08/25/2015   MCV 85.5 08/25/2015   PLT 234 08/25/2015   Lab Results  Component Value Date   CREATININE 0.68 08/25/2015   BUN 8 08/25/2015   NA 138 08/25/2015   K 3.8 08/25/2015   CL 101 08/25/2015   CO2 28 08/25/2015   No results found for: INR, PROTIME  Anesthesia Physical Anesthesia Plan  ASA: III  Anesthesia Plan: General   Post-op Pain Management:    Induction:   Airway Management Planned: LMA  Additional Equipment:   Intra-op Plan:   Post-operative Plan: Extubation in OR  Informed Consent: I have reviewed the patients History and Physical, chart, labs and discussed the procedure including the risks, benefits and alternatives for the proposed anesthesia with the patient or authorized representative who has indicated his/her understanding and acceptance.    Dental advisory given  Plan Discussed with: CRNA  Anesthesia Plan Comments: (Ms. Fukushima states she will go to her sister's home after she is discharged from recovery. She was notified she cannot go home after the procedure if an adult is not present to assist her. Her 66 y/o relative is not appropriate to provide care. )       Anesthesia Quick Evaluation

## 2015-08-26 NOTE — Anesthesia Postprocedure Evaluation (Signed)
  Anesthesia Post-op Note  Patient: Traci Morales  Procedure(s) Performed: Procedure(s): DILATATION & CURETTAGE/HYSTEROSCOPY, REMOVAL OF ENDOMETRIAL MASS WITH MYOSURE (N/A)  Patient Location: PACU  Anesthesia Type:General  Level of Consciousness: awake, alert  and oriented  Airway and Oxygen Therapy: Patient Spontanous Breathing  Post-op Pain: 2 /10  Post-op Assessment: Post-op Vital signs reviewed and Patient's Cardiovascular Status Stable              Post-op Vital Signs: Reviewed and stable  Last Vitals:  Filed Vitals:   08/26/15 1343  BP: 135/64  Pulse: 69  Temp:   Resp: 20    Complications: No apparent anesthesia complications

## 2015-08-27 ENCOUNTER — Encounter (HOSPITAL_COMMUNITY): Payer: Self-pay | Admitting: Obstetrics and Gynecology

## 2015-08-31 LAB — GLUCOSE, CAPILLARY: GLUCOSE-CAPILLARY: 152 mg/dL — AB (ref 65–99)

## 2015-09-07 DIAGNOSIS — Z23 Encounter for immunization: Secondary | ICD-10-CM | POA: Diagnosis not present

## 2015-09-09 DIAGNOSIS — N840 Polyp of corpus uteri: Secondary | ICD-10-CM | POA: Diagnosis not present

## 2015-11-15 DIAGNOSIS — I10 Essential (primary) hypertension: Secondary | ICD-10-CM | POA: Diagnosis not present

## 2015-11-15 DIAGNOSIS — E119 Type 2 diabetes mellitus without complications: Secondary | ICD-10-CM | POA: Diagnosis not present

## 2015-11-15 DIAGNOSIS — E669 Obesity, unspecified: Secondary | ICD-10-CM | POA: Diagnosis not present

## 2015-11-15 DIAGNOSIS — F324 Major depressive disorder, single episode, in partial remission: Secondary | ICD-10-CM | POA: Diagnosis not present

## 2015-11-15 DIAGNOSIS — Z6837 Body mass index (BMI) 37.0-37.9, adult: Secondary | ICD-10-CM | POA: Diagnosis not present

## 2015-11-15 DIAGNOSIS — M8580 Other specified disorders of bone density and structure, unspecified site: Secondary | ICD-10-CM | POA: Diagnosis not present

## 2015-11-15 DIAGNOSIS — Z1389 Encounter for screening for other disorder: Secondary | ICD-10-CM | POA: Diagnosis not present

## 2015-11-15 DIAGNOSIS — Z0000 Encounter for general adult medical examination without abnormal findings: Secondary | ICD-10-CM | POA: Diagnosis not present

## 2015-11-15 DIAGNOSIS — Z7984 Long term (current) use of oral hypoglycemic drugs: Secondary | ICD-10-CM | POA: Diagnosis not present

## 2015-11-15 DIAGNOSIS — E7800 Pure hypercholesterolemia, unspecified: Secondary | ICD-10-CM | POA: Diagnosis not present

## 2015-11-30 DIAGNOSIS — H401121 Primary open-angle glaucoma, left eye, mild stage: Secondary | ICD-10-CM | POA: Diagnosis not present

## 2015-11-30 DIAGNOSIS — E119 Type 2 diabetes mellitus without complications: Secondary | ICD-10-CM | POA: Diagnosis not present

## 2015-11-30 DIAGNOSIS — H40001 Preglaucoma, unspecified, right eye: Secondary | ICD-10-CM | POA: Diagnosis not present

## 2015-11-30 DIAGNOSIS — H25813 Combined forms of age-related cataract, bilateral: Secondary | ICD-10-CM | POA: Diagnosis not present

## 2016-01-21 DIAGNOSIS — Z0000 Encounter for general adult medical examination without abnormal findings: Secondary | ICD-10-CM | POA: Diagnosis not present

## 2016-01-21 DIAGNOSIS — Z1211 Encounter for screening for malignant neoplasm of colon: Secondary | ICD-10-CM | POA: Diagnosis not present

## 2016-01-25 ENCOUNTER — Other Ambulatory Visit: Payer: Self-pay

## 2016-01-25 DIAGNOSIS — Z1231 Encounter for screening mammogram for malignant neoplasm of breast: Secondary | ICD-10-CM

## 2016-03-02 ENCOUNTER — Ambulatory Visit: Payer: Medicare Other

## 2016-03-29 ENCOUNTER — Ambulatory Visit
Admission: RE | Admit: 2016-03-29 | Discharge: 2016-03-29 | Disposition: A | Discharge status: Home / Self Care | Payer: Medicare Other | Source: Ambulatory Visit

## 2016-03-29 DIAGNOSIS — Z1231 Encounter for screening mammogram for malignant neoplasm of breast: Secondary | ICD-10-CM | POA: Diagnosis not present

## 2016-06-06 DIAGNOSIS — H401121 Primary open-angle glaucoma, left eye, mild stage: Secondary | ICD-10-CM | POA: Diagnosis not present

## 2016-06-06 DIAGNOSIS — H40001 Preglaucoma, unspecified, right eye: Secondary | ICD-10-CM | POA: Diagnosis not present

## 2016-07-03 DIAGNOSIS — E119 Type 2 diabetes mellitus without complications: Secondary | ICD-10-CM | POA: Diagnosis not present

## 2016-07-03 DIAGNOSIS — H40001 Preglaucoma, unspecified, right eye: Secondary | ICD-10-CM | POA: Diagnosis not present

## 2016-07-03 DIAGNOSIS — H25813 Combined forms of age-related cataract, bilateral: Secondary | ICD-10-CM | POA: Diagnosis not present

## 2016-07-03 DIAGNOSIS — H401121 Primary open-angle glaucoma, left eye, mild stage: Secondary | ICD-10-CM | POA: Diagnosis not present

## 2016-07-17 ENCOUNTER — Other Ambulatory Visit: Payer: Self-pay | Admitting: Internal Medicine

## 2016-07-17 DIAGNOSIS — Z7984 Long term (current) use of oral hypoglycemic drugs: Secondary | ICD-10-CM | POA: Diagnosis not present

## 2016-07-17 DIAGNOSIS — F324 Major depressive disorder, single episode, in partial remission: Secondary | ICD-10-CM | POA: Diagnosis not present

## 2016-07-17 DIAGNOSIS — I10 Essential (primary) hypertension: Secondary | ICD-10-CM | POA: Diagnosis not present

## 2016-07-17 DIAGNOSIS — E119 Type 2 diabetes mellitus without complications: Secondary | ICD-10-CM | POA: Diagnosis not present

## 2016-07-17 DIAGNOSIS — R011 Cardiac murmur, unspecified: Secondary | ICD-10-CM | POA: Diagnosis not present

## 2016-07-24 ENCOUNTER — Ambulatory Visit (HOSPITAL_COMMUNITY): Payer: Medicare Other | Attending: Cardiovascular Disease

## 2016-07-24 ENCOUNTER — Other Ambulatory Visit: Payer: Self-pay

## 2016-07-24 DIAGNOSIS — I517 Cardiomegaly: Secondary | ICD-10-CM | POA: Insufficient documentation

## 2016-07-24 DIAGNOSIS — I350 Nonrheumatic aortic (valve) stenosis: Secondary | ICD-10-CM | POA: Diagnosis not present

## 2016-07-24 DIAGNOSIS — Z87891 Personal history of nicotine dependence: Secondary | ICD-10-CM | POA: Diagnosis not present

## 2016-07-24 DIAGNOSIS — I071 Rheumatic tricuspid insufficiency: Secondary | ICD-10-CM | POA: Diagnosis not present

## 2016-07-24 DIAGNOSIS — R011 Cardiac murmur, unspecified: Secondary | ICD-10-CM | POA: Diagnosis not present

## 2016-07-31 DIAGNOSIS — H40001 Preglaucoma, unspecified, right eye: Secondary | ICD-10-CM | POA: Diagnosis not present

## 2016-07-31 DIAGNOSIS — H25811 Combined forms of age-related cataract, right eye: Secondary | ICD-10-CM | POA: Diagnosis not present

## 2016-07-31 DIAGNOSIS — H2511 Age-related nuclear cataract, right eye: Secondary | ICD-10-CM | POA: Diagnosis not present

## 2016-08-24 DIAGNOSIS — Z23 Encounter for immunization: Secondary | ICD-10-CM | POA: Diagnosis not present

## 2016-09-03 ENCOUNTER — Encounter (HOSPITAL_COMMUNITY): Payer: Self-pay | Admitting: *Deleted

## 2016-09-03 ENCOUNTER — Emergency Department (HOSPITAL_COMMUNITY)
Admission: EM | Admit: 2016-09-03 | Discharge: 2016-09-03 | Disposition: A | Discharge status: Home / Self Care | Payer: Medicare Other | Attending: Emergency Medicine | Admitting: Emergency Medicine

## 2016-09-03 DIAGNOSIS — Z87891 Personal history of nicotine dependence: Secondary | ICD-10-CM | POA: Insufficient documentation

## 2016-09-03 DIAGNOSIS — R52 Pain, unspecified: Secondary | ICD-10-CM | POA: Diagnosis not present

## 2016-09-03 DIAGNOSIS — M791 Myalgia: Secondary | ICD-10-CM | POA: Diagnosis not present

## 2016-09-03 DIAGNOSIS — E119 Type 2 diabetes mellitus without complications: Secondary | ICD-10-CM | POA: Diagnosis not present

## 2016-09-03 DIAGNOSIS — Z7984 Long term (current) use of oral hypoglycemic drugs: Secondary | ICD-10-CM | POA: Insufficient documentation

## 2016-09-03 DIAGNOSIS — I10 Essential (primary) hypertension: Secondary | ICD-10-CM | POA: Insufficient documentation

## 2016-09-03 DIAGNOSIS — Z79899 Other long term (current) drug therapy: Secondary | ICD-10-CM | POA: Insufficient documentation

## 2016-09-03 LAB — URINALYSIS, ROUTINE W REFLEX MICROSCOPIC
BILIRUBIN URINE: NEGATIVE
Glucose, UA: NEGATIVE mg/dL
Hgb urine dipstick: NEGATIVE
KETONES UR: NEGATIVE mg/dL
Leukocytes, UA: NEGATIVE
NITRITE: NEGATIVE
Protein, ur: NEGATIVE mg/dL
Specific Gravity, Urine: 1.005 (ref 1.005–1.030)
pH: 5 (ref 5.0–8.0)

## 2016-09-03 LAB — I-STAT TROPONIN, ED: TROPONIN I, POC: 0 ng/mL (ref 0.00–0.08)

## 2016-09-03 LAB — I-STAT CHEM 8, ED
BUN: 18 mg/dL (ref 6–20)
CALCIUM ION: 1.25 mmol/L (ref 1.15–1.40)
Chloride: 108 mmol/L (ref 101–111)
Creatinine, Ser: 0.6 mg/dL (ref 0.44–1.00)
Glucose, Bld: 180 mg/dL — ABNORMAL HIGH (ref 65–99)
HCT: 43 % (ref 36.0–46.0)
HEMOGLOBIN: 14.6 g/dL (ref 12.0–15.0)
Potassium: 3.9 mmol/L (ref 3.5–5.1)
SODIUM: 141 mmol/L (ref 135–145)
TCO2: 26 mmol/L (ref 0–100)

## 2016-09-03 NOTE — ED Triage Notes (Signed)
THE PT IS C/O GENERAL BODY ACHES FOR 1-2 WEEK  WITH COLD SYMPTOMS SNEEZING CHILLS AND SWEATING

## 2016-09-03 NOTE — Discharge Instructions (Signed)
All the results in the ER are normal, labs and imaging. We are not sure what is causing your symptoms. The workup in the ER is not complete, and is limited to screening for life threatening and emergent conditions only, so please see a primary care doctor for further evaluation.  

## 2016-09-03 NOTE — ED Provider Notes (Addendum)
[facility] DEPT Provider Note   CSN: [id number] Arrival date & time: 09/03/16  0303     History   Chief Complaint Chief Complaint  Patient presents with  . Generalized Body Aches    HPI Traci Morales is a 65 y.o. female.  HPI   PT comes in with cc of feeling hot and cold and having body aches. She has been having her symptoms for 1 week. Reports intermittentlyDictation #1 [id number]  [id number]  feeling hot. Currently she feels cold however. She also has some body aches occasionally. Pt denies nausea, emesis, fevers, shaking chills, chest pains, shortness of breath, cough, headaches, neck pain or stiffness, abdominal pain, diarrhea, uti like symptoms, rash, joint aches. She has DM hx, her blood sugar was normal last night, but elevated this morning.    Past Medical History:  Diagnosis Date  . Anxiety   . Depression   . Diabetes mellitus without complication ([place])   . Hyperlipemia   . Hypertension   . Nasal congestion 08/25/15   per patient she has environmental allergies  . PONV (postoperative nausea and vomiting)    nausea only    There are no active problems to display for this patient.   Past Surgical History:  Procedure Laterality Date  . DILATATION & CURETTAGE/HYSTEROSCOPY WITH MYOSURE N/A 08/26/2015   Procedure: DILATATION & CURETTAGE/HYSTEROSCOPY, REMOVAL OF ENDOMETRIAL MASS WITH MYOSURE;  Surgeon: Christophe Louis, MD;  Location: [facility];  Service: Gynecology;  Laterality: N/A;  . PILONIDAL CYST EXCISION      OB History    No data available       Home Medications    Prior to Admission medications   Medication Sig Start Date End Date Taking? Authorizing Provider  amLODipine (NORVASC) 10 MG tablet Take 10 mg by mouth daily.  08/17/15  Yes Historical Provider, MD  dorzolamide (TRUSOPT) 2 % ophthalmic solution Place 1 drop into both eyes daily. 08/09/15  Yes Historical Provider, MD  fluocinonide (LIDEX) 0.05 % external solution Apply 1 application  topically once a week. 07/08/15  Yes Historical Provider, MD  FLUoxetine (PROZAC) 20 MG capsule Take 1 capsule by mouth daily. 08/19/15  Yes Historical Provider, MD  glimepiride (AMARYL) 4 MG tablet Take 2 mg by mouth daily. 08/19/15  Yes Historical Provider, MD  JANUVIA 100 MG tablet Take 1 tablet by mouth daily. 07/29/15  Yes Historical Provider, MD  lisinopril-hydrochlorothiazide (PRINZIDE,ZESTORETIC) 20-25 MG per tablet Take 1 tablet by mouth daily. 07/21/15  Yes Historical Provider, MD  LORazepam (ATIVAN) 1 MG tablet Take 1 mg by mouth at bedtime. 07/30/15  Yes Historical Provider, MD  metFORMIN (GLUCOPHAGE-XR) 500 MG 24 hr tablet Take 4 tablets by mouth daily. Pt prefers to take all 4 tablets at same time 08/02/15  Yes Historical Provider, MD  pioglitazone (ACTOS) 30 MG tablet Take 1 tablet by mouth daily. 07/29/15  Yes Historical Provider, MD  simvastatin (ZOCOR) 20 MG tablet Take 1 tablet by mouth daily. 07/29/15  Yes Historical Provider, MD  spironolactone (ALDACTONE) 25 MG tablet Take 1 tablet by mouth daily. 08/19/15  Yes Historical Provider, MD  TRAVATAN Z 0.004 % SOLN ophthalmic solution Place 1 drop into both eyes at bedtime. 08/09/15  Yes Historical Provider, MD    Family History No family history on file.  Social History Social History  Substance Use Topics  . Smoking status: Former Research scientist (life sciences)  . Smokeless tobacco: Never Used  . Alcohol use No     Allergies   Review of patient's  allergies indicates no known allergies.   Review of Systems Review of Systems  ROS 10 Systems reviewed and are negative for acute change except as noted in the HPI.     Physical Exam Updated Vital Signs BP (!) 164/119 (BP Location: Left Arm)   Pulse 101   Temp 98.4 F (36.9 C) (Oral)   Resp 20   Ht 5\' 5"  (1.651 m)   Wt 220 lb (99.8 kg)   SpO2 100%   BMI 36.61 kg/m   Physical Exam  Constitutional: She is oriented to person, place, and time. She appears well-developed.  HENT:  Head:  Normocephalic and atraumatic.  Eyes: EOM are normal.  Neck: Normal range of motion. Neck supple.  Cardiovascular: Normal rate.   Pulmonary/Chest: Effort normal.  Abdominal: Bowel sounds are normal.  Neurological: She is alert and oriented to person, place, and time.  Skin: Skin is warm and dry.  Nursing note and vitals reviewed.    ED Treatments / Results  Labs (all labs ordered are listed, but only abnormal results are displayed) Labs Reviewed  I-STAT CHEM 8, ED - Abnormal; Notable for the following:       Result Value   Glucose, Bld 180 (*)    All other components within normal limits  URINALYSIS, ROUTINE W REFLEX MICROSCOPIC (NOT AT [facility])  I-STAT TROPOININ, ED  CBG MONITORING, ED    EKG  EKG Interpretation None       Radiology No results found.  Procedures Procedures (including critical care time)  Medications Ordered in ED Medications - No data to display   Initial Impression / Assessment and Plan / ED Course  I have reviewed the triage vital signs and the nursing notes.  Pertinent labs & imaging results that were available during my care of the patient were reviewed by me and considered in my medical decision making (see chart for details).  Clinical Course   Pt with very non specific complains. We will check UA and electrolytes. WE will get troponin and EKG. ROS is widely negative besides what's mentioned in the HPI.  Final Clinical Impressions(s) / ED Diagnoses   Final diagnoses:  Body aches    New Prescriptions New Prescriptions   No medications on file     Varney Biles, MD 09/03/16 [place], MD 09/03/16 [phone]

## 2016-09-03 NOTE — ED Notes (Signed)
Pt ambulatory w/ steady gait to restroom. 

## 2016-10-10 DIAGNOSIS — H43811 Vitreous degeneration, right eye: Secondary | ICD-10-CM | POA: Diagnosis not present

## 2016-10-10 DIAGNOSIS — H401121 Primary open-angle glaucoma, left eye, mild stage: Secondary | ICD-10-CM | POA: Diagnosis not present

## 2016-10-10 DIAGNOSIS — E113293 Type 2 diabetes mellitus with mild nonproliferative diabetic retinopathy without macular edema, bilateral: Secondary | ICD-10-CM | POA: Diagnosis not present

## 2016-12-26 ENCOUNTER — Other Ambulatory Visit (HOSPITAL_COMMUNITY)
Admission: RE | Admit: 2016-12-26 | Discharge: 2016-12-26 | Disposition: A | Discharge status: Home / Self Care | Payer: Medicare Other | Source: Ambulatory Visit | Attending: Obstetrics and Gynecology | Admitting: Obstetrics and Gynecology

## 2016-12-26 ENCOUNTER — Other Ambulatory Visit: Payer: Self-pay | Admitting: Obstetrics and Gynecology

## 2016-12-26 DIAGNOSIS — Z1151 Encounter for screening for human papillomavirus (HPV): Secondary | ICD-10-CM | POA: Insufficient documentation

## 2016-12-26 DIAGNOSIS — Z01419 Encounter for gynecological examination (general) (routine) without abnormal findings: Secondary | ICD-10-CM | POA: Insufficient documentation

## 2016-12-26 DIAGNOSIS — Z7251 High risk heterosexual behavior: Secondary | ICD-10-CM | POA: Diagnosis not present

## 2016-12-26 DIAGNOSIS — Z9189 Other specified personal risk factors, not elsewhere classified: Secondary | ICD-10-CM | POA: Diagnosis not present

## 2016-12-28 LAB — CYTOLOGY - PAP
DIAGNOSIS: NEGATIVE
HPV (WINDOPATH): NOT DETECTED

## 2017-01-22 DIAGNOSIS — F324 Major depressive disorder, single episode, in partial remission: Secondary | ICD-10-CM | POA: Diagnosis not present

## 2017-01-22 DIAGNOSIS — E113293 Type 2 diabetes mellitus with mild nonproliferative diabetic retinopathy without macular edema, bilateral: Secondary | ICD-10-CM | POA: Diagnosis not present

## 2017-01-22 DIAGNOSIS — Z6838 Body mass index (BMI) 38.0-38.9, adult: Secondary | ICD-10-CM | POA: Diagnosis not present

## 2017-01-22 DIAGNOSIS — M8580 Other specified disorders of bone density and structure, unspecified site: Secondary | ICD-10-CM | POA: Diagnosis not present

## 2017-01-22 DIAGNOSIS — I10 Essential (primary) hypertension: Secondary | ICD-10-CM | POA: Diagnosis not present

## 2017-01-22 DIAGNOSIS — Z0000 Encounter for general adult medical examination without abnormal findings: Secondary | ICD-10-CM | POA: Diagnosis not present

## 2017-01-22 DIAGNOSIS — E7800 Pure hypercholesterolemia, unspecified: Secondary | ICD-10-CM | POA: Diagnosis not present

## 2017-01-22 DIAGNOSIS — Z7984 Long term (current) use of oral hypoglycemic drugs: Secondary | ICD-10-CM | POA: Diagnosis not present

## 2017-01-22 DIAGNOSIS — E669 Obesity, unspecified: Secondary | ICD-10-CM | POA: Diagnosis not present

## 2017-02-01 DIAGNOSIS — Z1211 Encounter for screening for malignant neoplasm of colon: Secondary | ICD-10-CM | POA: Diagnosis not present

## 2017-02-02 DIAGNOSIS — H25812 Combined forms of age-related cataract, left eye: Secondary | ICD-10-CM | POA: Diagnosis not present

## 2017-02-02 DIAGNOSIS — H40001 Preglaucoma, unspecified, right eye: Secondary | ICD-10-CM | POA: Diagnosis not present

## 2017-02-02 DIAGNOSIS — E119 Type 2 diabetes mellitus without complications: Secondary | ICD-10-CM | POA: Diagnosis not present

## 2017-02-02 DIAGNOSIS — H401121 Primary open-angle glaucoma, left eye, mild stage: Secondary | ICD-10-CM | POA: Diagnosis not present

## 2017-03-15 DIAGNOSIS — M8588 Other specified disorders of bone density and structure, other site: Secondary | ICD-10-CM | POA: Diagnosis not present

## 2017-03-15 DIAGNOSIS — M810 Age-related osteoporosis without current pathological fracture: Secondary | ICD-10-CM | POA: Diagnosis not present

## 2017-03-22 ENCOUNTER — Other Ambulatory Visit: Payer: Self-pay | Admitting: Internal Medicine

## 2017-03-22 DIAGNOSIS — Z1231 Encounter for screening mammogram for malignant neoplasm of breast: Secondary | ICD-10-CM

## 2017-04-12 ENCOUNTER — Ambulatory Visit
Admission: RE | Admit: 2017-04-12 | Discharge: 2017-04-12 | Disposition: A | Discharge status: Home / Self Care | Payer: Medicare Other | Source: Ambulatory Visit | Attending: Internal Medicine | Admitting: Internal Medicine

## 2017-04-12 DIAGNOSIS — Z1231 Encounter for screening mammogram for malignant neoplasm of breast: Secondary | ICD-10-CM

## 2017-04-13 ENCOUNTER — Ambulatory Visit: Payer: Medicare Other

## 2017-04-15 ENCOUNTER — Emergency Department (HOSPITAL_COMMUNITY)
Admission: EM | Admit: 2017-04-15 | Discharge: 2017-04-15 | Disposition: A | Discharge status: Home / Self Care | Payer: Medicare Other | Attending: Emergency Medicine | Admitting: Emergency Medicine

## 2017-04-15 ENCOUNTER — Encounter (HOSPITAL_COMMUNITY): Payer: Self-pay

## 2017-04-15 ENCOUNTER — Emergency Department (HOSPITAL_COMMUNITY): Payer: Medicare Other

## 2017-04-15 DIAGNOSIS — W010XXA Fall on same level from slipping, tripping and stumbling without subsequent striking against object, initial encounter: Secondary | ICD-10-CM | POA: Insufficient documentation

## 2017-04-15 DIAGNOSIS — M25561 Pain in right knee: Secondary | ICD-10-CM | POA: Diagnosis not present

## 2017-04-15 DIAGNOSIS — M7989 Other specified soft tissue disorders: Secondary | ICD-10-CM | POA: Insufficient documentation

## 2017-04-15 DIAGNOSIS — S8992XA Unspecified injury of left lower leg, initial encounter: Secondary | ICD-10-CM | POA: Diagnosis present

## 2017-04-15 DIAGNOSIS — Y929 Unspecified place or not applicable: Secondary | ICD-10-CM | POA: Insufficient documentation

## 2017-04-15 DIAGNOSIS — M25562 Pain in left knee: Secondary | ICD-10-CM | POA: Diagnosis not present

## 2017-04-15 DIAGNOSIS — Y939 Activity, unspecified: Secondary | ICD-10-CM | POA: Insufficient documentation

## 2017-04-15 DIAGNOSIS — Z87891 Personal history of nicotine dependence: Secondary | ICD-10-CM | POA: Diagnosis not present

## 2017-04-15 DIAGNOSIS — Z7984 Long term (current) use of oral hypoglycemic drugs: Secondary | ICD-10-CM | POA: Insufficient documentation

## 2017-04-15 DIAGNOSIS — Y999 Unspecified external cause status: Secondary | ICD-10-CM | POA: Insufficient documentation

## 2017-04-15 DIAGNOSIS — I10 Essential (primary) hypertension: Secondary | ICD-10-CM | POA: Insufficient documentation

## 2017-04-15 DIAGNOSIS — E119 Type 2 diabetes mellitus without complications: Secondary | ICD-10-CM | POA: Diagnosis not present

## 2017-04-15 DIAGNOSIS — M25462 Effusion, left knee: Secondary | ICD-10-CM | POA: Diagnosis not present

## 2017-04-15 DIAGNOSIS — W19XXXA Unspecified fall, initial encounter: Secondary | ICD-10-CM

## 2017-04-15 DIAGNOSIS — M79644 Pain in right finger(s): Secondary | ICD-10-CM | POA: Diagnosis not present

## 2017-04-15 NOTE — ED Notes (Signed)
Returned from xray

## 2017-04-15 NOTE — ED Triage Notes (Signed)
Patient complains of bilateral knee discomfort with swelling after tripping over parking obstacle this am. Alert and oriented, also small abrasion to right hand

## 2017-04-15 NOTE — Discharge Instructions (Signed)
As discussed, follow the rice protocol provided in this packet. Follow up with your primary care provider.   Return if symptoms worsen, you experience numbness or any new concerning symptoms in the meantime.

## 2017-04-15 NOTE — ED Provider Notes (Signed)
[facility] DEPT Provider Note   CSN: [id number] Arrival date & time: 04/15/17  1046  By signing my name below, I, Dora Sims, attest that this documentation has been prepared under the direction and in the presence of Avie Echevaria, PA-C. Electronically Signed: Dora Sims, Scribe. 04/15/2017. 11:35 AM.  History   Chief Complaint Chief Complaint  Patient presents with  . Knee Pain   The history is provided by the patient. No language interpreter was used.    HPI Comments: Traci Morales is a 66 y.o. female with PMHx including DM, HTN, and HLD who presents to the Emergency Department complaining of constant, mild bilateral knee pain s/p a mechanical fall that occurred about 40 minutes ago. She states she tripped over an object and struck her bilateral knees on concrete. No head trauma or LOC. She reports associated swelling. She states her pain is worse in her left knee and is exacerbated by weight bearing and applied pressure to her knees. Patient also notes some mild swelling to her right pinky finger as a result of the fall with some aching pain that is worse with flexion of the finger. No medications or treatments tried PTA. She denies numbness/tingling, focal weakness, wounds, or any other associated symptoms.  Past Medical History:  Diagnosis Date  . Anxiety   . Depression   . Diabetes mellitus without complication ([place])   . Hyperlipemia   . Hypertension   . Nasal congestion 08/25/15   per patient she has environmental allergies  . PONV (postoperative nausea and vomiting)    nausea only    There are no active problems to display for this patient.   Past Surgical History:  Procedure Laterality Date  . DILATATION & CURETTAGE/HYSTEROSCOPY WITH MYOSURE N/A 08/26/2015   Procedure: DILATATION & CURETTAGE/HYSTEROSCOPY, REMOVAL OF ENDOMETRIAL MASS WITH MYOSURE;  Surgeon: Christophe Louis, MD;  Location: [facility];  Service: Gynecology;  Laterality: N/A;  . PILONIDAL CYST EXCISION       OB History    No data available       Home Medications    Prior to Admission medications   Medication Sig Start Date End Date Taking? Authorizing Provider  amLODipine (NORVASC) 10 MG tablet Take 10 mg by mouth daily.  08/17/15   Historical Provider, MD  dorzolamide (TRUSOPT) 2 % ophthalmic solution Place 1 drop into both eyes daily. 08/09/15   Historical Provider, MD  fluocinonide (LIDEX) 0.05 % external solution Apply 1 application topically once a week. 07/08/15   Historical Provider, MD  FLUoxetine (PROZAC) 20 MG capsule Take 1 capsule by mouth daily. 08/19/15   Historical Provider, MD  glimepiride (AMARYL) 4 MG tablet Take 2 mg by mouth daily. 08/19/15   Historical Provider, MD  JANUVIA 100 MG tablet Take 1 tablet by mouth daily. 07/29/15   Historical Provider, MD  lisinopril-hydrochlorothiazide (PRINZIDE,ZESTORETIC) 20-25 MG per tablet Take 1 tablet by mouth daily. 07/21/15   Historical Provider, MD  LORazepam (ATIVAN) 1 MG tablet Take 1 mg by mouth at bedtime. 07/30/15   Historical Provider, MD  metFORMIN (GLUCOPHAGE-XR) 500 MG 24 hr tablet Take 4 tablets by mouth daily. Pt prefers to take all 4 tablets at same time 08/02/15   Historical Provider, MD  pioglitazone (ACTOS) 30 MG tablet Take 1 tablet by mouth daily. 07/29/15   Historical Provider, MD  simvastatin (ZOCOR) 20 MG tablet Take 1 tablet by mouth daily. 07/29/15   Historical Provider, MD  spironolactone (ALDACTONE) 25 MG tablet Take 1 tablet by mouth  daily. 08/19/15   Historical Provider, MD  TRAVATAN Z 0.004 % SOLN ophthalmic solution Place 1 drop into both eyes at bedtime. 08/09/15   Historical Provider, MD    Family History No family history on file.  Social History Social History  Substance Use Topics  . Smoking status: Former Research scientist (life sciences)  . Smokeless tobacco: Never Used  . Alcohol use No     Allergies   Patient has no known allergies.   Review of Systems Review of Systems  Musculoskeletal: Positive for arthralgias and  joint swelling.  Skin: Negative for wound.  Neurological: Negative for syncope, weakness and numbness.   Physical Exam Updated Vital Signs BP (!) 130/53   Pulse 72   Temp 98.1 F (36.7 C) (Oral)   Resp 16   Ht 5\' 4"  (1.626 m)   Wt 102.1 kg   SpO2 100%   BMI 38.62 kg/m   Physical Exam  Constitutional: She is oriented to person, place, and time. She appears well-developed and well-nourished. No distress.  Patient is afebrile, non-toxic appearing, sitting comfortably in chair in no acute distress.  HENT:  Head: Normocephalic and atraumatic.  Eyes: Conjunctivae and EOM are normal.  Neck: Neck supple. No tracheal deviation present.  Cardiovascular: Normal rate.   Pulmonary/Chest: Effort normal. No respiratory distress.  Musculoskeletal: Normal range of motion.  Negative anterior drawer test bilaterally. Mild swelling to the medial left knee. Mild swelling to the PIP of the right pinky finger and pain with movement of this digit.  Neurological: She is alert and oriented to person, place, and time.  Neurovascularly intact distally. Normal gait. 5/5 strength in lower extremites bilaterally. Strong grips, 5/5 strength with resisted flexion at the dip and pip  Skin: Skin is warm and dry.  Psychiatric: She has a normal mood and affect. Her behavior is normal.  Nursing note and vitals reviewed.  ED Treatments / Results  Labs (all labs ordered are listed, but only abnormal results are displayed) Labs Reviewed - No data to display  EKG  EKG Interpretation None       Radiology Dg Knee Complete 4 Views Left  Result Date: 04/15/2017 CLINICAL DATA:  [place] yesterday from standing onto both knees; Left knee is tender to palpation; Right little finger is swollen and painful from same fall EXAM: LEFT KNEE - COMPLETE 4+ VIEW COMPARISON:  None. FINDINGS: No fracture.  No bone lesion. There is mild sclerosis and irregularity along the dorsal margin of the patella. This appears degenerative in  origin. There is mild medial compartment narrowing with small medial marginal osteophytes. No other arthropathic change. No joint effusion. Soft tissues are unremarkable. IMPRESSION: No fracture or acute finding. Electronically Signed   By: Lajean Manes M.D.   On: 04/15/2017 12:54   Dg Finger Little Right  Result Date: 04/15/2017 CLINICAL DATA:  [place] yesterday from standing onto both knees; Left knee is tender to palpation; Right little finger is swollen and painful from same fall EXAM: RIGHT LITTLE FINGER 2+V COMPARISON:  None. FINDINGS: No fracture or dislocation. The PIP joint is held in flexion with mild extension at the DIP joint. This is suspicious for a tendon injury. Mild soft tissue swelling is seen adjacent to the PIP joint. IMPRESSION: 1. No fracture or dislocation. 2. Flexion deformity at the PIP joint of the right fifth finger. 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 Electronically Signed   By: Lajean Manes M.D.   On: 04/15/2017 12:57    Procedures Procedures (  including critical care time)  DIAGNOSTIC STUDIES: Oxygen Saturation is 100% on RA, normal by my interpretation.    COORDINATION OF CARE: 11:42 AM Discussed treatment plan with pt at bedside and pt agreed to plan.  Medications Ordered in ED Medications - No data to display   Initial Impression / Assessment and Plan / ED Course  I have reviewed the triage vital signs and the nursing notes.  Pertinent labs & imaging results that were available during my care of the patient were reviewed by me and considered in my medical decision making (see chart for details).      Patient X-Ray negative for obvious fracture or dislocation. Reassuring exam. Pt advised to follow up with Primary care provider. Patient given sleeve while in ED, conservative therapy recommended and discussed. Patient will be discharged home & is agreeable with above plan. Returns precautions discussed. Pt appears safe for  discharge.  Discussed strict return precautions and advised to return to the emergency department if experiencing any new or worsening symptoms. Instructions were understood and patient agreed with discharge plan.  Final Clinical Impressions(s) / ED Diagnoses   Final diagnoses:  Fall, initial encounter    New Prescriptions Discharge Medication List as of 04/15/2017  1:37 PM     I personally performed the services described in this documentation, which was scribed in my presence. The recorded information has been reviewed and is accurate.    Emeline General, PA-C 04/15/17 [place], DO 04/18/17 1951

## 2017-04-15 NOTE — ED Notes (Signed)
Patient transported to X-ray 

## 2017-04-17 DIAGNOSIS — M25562 Pain in left knee: Secondary | ICD-10-CM | POA: Diagnosis not present

## 2017-08-07 DIAGNOSIS — H25812 Combined forms of age-related cataract, left eye: Secondary | ICD-10-CM | POA: Diagnosis not present

## 2017-08-07 DIAGNOSIS — H40001 Preglaucoma, unspecified, right eye: Secondary | ICD-10-CM | POA: Diagnosis not present

## 2017-08-07 DIAGNOSIS — H401121 Primary open-angle glaucoma, left eye, mild stage: Secondary | ICD-10-CM | POA: Diagnosis not present

## 2017-08-21 DIAGNOSIS — Z23 Encounter for immunization: Secondary | ICD-10-CM | POA: Diagnosis not present

## 2017-09-14 DIAGNOSIS — Z7984 Long term (current) use of oral hypoglycemic drugs: Secondary | ICD-10-CM | POA: Diagnosis not present

## 2017-09-14 DIAGNOSIS — E119 Type 2 diabetes mellitus without complications: Secondary | ICD-10-CM | POA: Diagnosis not present

## 2017-09-14 DIAGNOSIS — I10 Essential (primary) hypertension: Secondary | ICD-10-CM | POA: Diagnosis not present

## 2017-11-15 DIAGNOSIS — H2512 Age-related nuclear cataract, left eye: Secondary | ICD-10-CM | POA: Diagnosis not present

## 2017-11-15 DIAGNOSIS — Z961 Presence of intraocular lens: Secondary | ICD-10-CM | POA: Diagnosis not present

## 2017-11-15 DIAGNOSIS — H401121 Primary open-angle glaucoma, left eye, mild stage: Secondary | ICD-10-CM | POA: Diagnosis not present

## 2017-11-15 DIAGNOSIS — H401111 Primary open-angle glaucoma, right eye, mild stage: Secondary | ICD-10-CM | POA: Diagnosis not present

## 2017-12-31 DIAGNOSIS — H2512 Age-related nuclear cataract, left eye: Secondary | ICD-10-CM | POA: Diagnosis not present

## 2017-12-31 DIAGNOSIS — H401121 Primary open-angle glaucoma, left eye, mild stage: Secondary | ICD-10-CM | POA: Diagnosis not present

## 2017-12-31 DIAGNOSIS — H409 Unspecified glaucoma: Secondary | ICD-10-CM | POA: Diagnosis not present

## 2017-12-31 DIAGNOSIS — H25812 Combined forms of age-related cataract, left eye: Secondary | ICD-10-CM | POA: Diagnosis not present

## 2018-01-22 DIAGNOSIS — H401121 Primary open-angle glaucoma, left eye, mild stage: Secondary | ICD-10-CM | POA: Diagnosis not present

## 2018-01-22 DIAGNOSIS — H2512 Age-related nuclear cataract, left eye: Secondary | ICD-10-CM | POA: Diagnosis not present

## 2018-01-24 DIAGNOSIS — E7800 Pure hypercholesterolemia, unspecified: Secondary | ICD-10-CM | POA: Diagnosis not present

## 2018-01-24 DIAGNOSIS — E119 Type 2 diabetes mellitus without complications: Secondary | ICD-10-CM | POA: Diagnosis not present

## 2018-01-24 DIAGNOSIS — S60149A Contusion of unspecified ring finger with damage to nail, initial encounter: Secondary | ICD-10-CM | POA: Diagnosis not present

## 2018-01-29 DIAGNOSIS — E119 Type 2 diabetes mellitus without complications: Secondary | ICD-10-CM | POA: Diagnosis not present

## 2018-01-29 DIAGNOSIS — I10 Essential (primary) hypertension: Secondary | ICD-10-CM | POA: Diagnosis not present

## 2018-01-29 DIAGNOSIS — G4700 Insomnia, unspecified: Secondary | ICD-10-CM | POA: Diagnosis not present

## 2018-01-29 DIAGNOSIS — Z1389 Encounter for screening for other disorder: Secondary | ICD-10-CM | POA: Diagnosis not present

## 2018-01-29 DIAGNOSIS — F324 Major depressive disorder, single episode, in partial remission: Secondary | ICD-10-CM | POA: Diagnosis not present

## 2018-01-29 DIAGNOSIS — Z0000 Encounter for general adult medical examination without abnormal findings: Secondary | ICD-10-CM | POA: Diagnosis not present

## 2018-02-07 DIAGNOSIS — Z7251 High risk heterosexual behavior: Secondary | ICD-10-CM | POA: Diagnosis not present

## 2018-02-07 DIAGNOSIS — N764 Abscess of vulva: Secondary | ICD-10-CM | POA: Diagnosis not present

## 2018-02-07 DIAGNOSIS — Z9189 Other specified personal risk factors, not elsewhere classified: Secondary | ICD-10-CM | POA: Diagnosis not present

## 2018-03-05 IMAGING — CR DG KNEE COMPLETE 4+V*L*
4 series · 4 of 4 positions shown · non-contrast
Comparison: None.

CLINICAL DATA: Fell yesterday from standing onto both knees; Left
knee is tender to palpation; Right little finger is swollen and
painful from same fall

EXAM:
LEFT KNEE - COMPLETE 4+ VIEW

[knee ap]
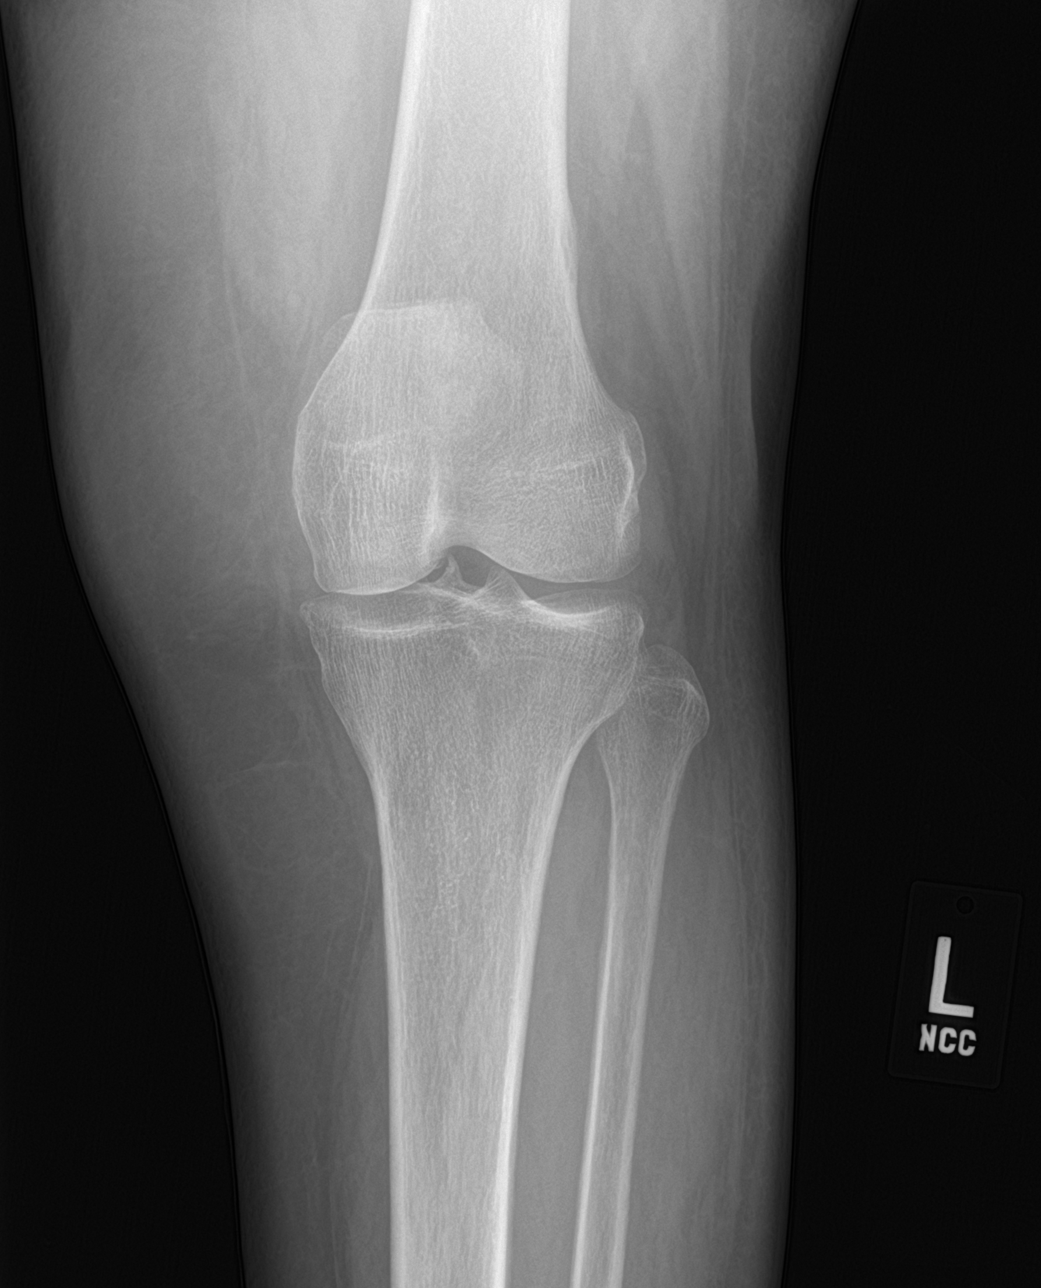

[knee lat]
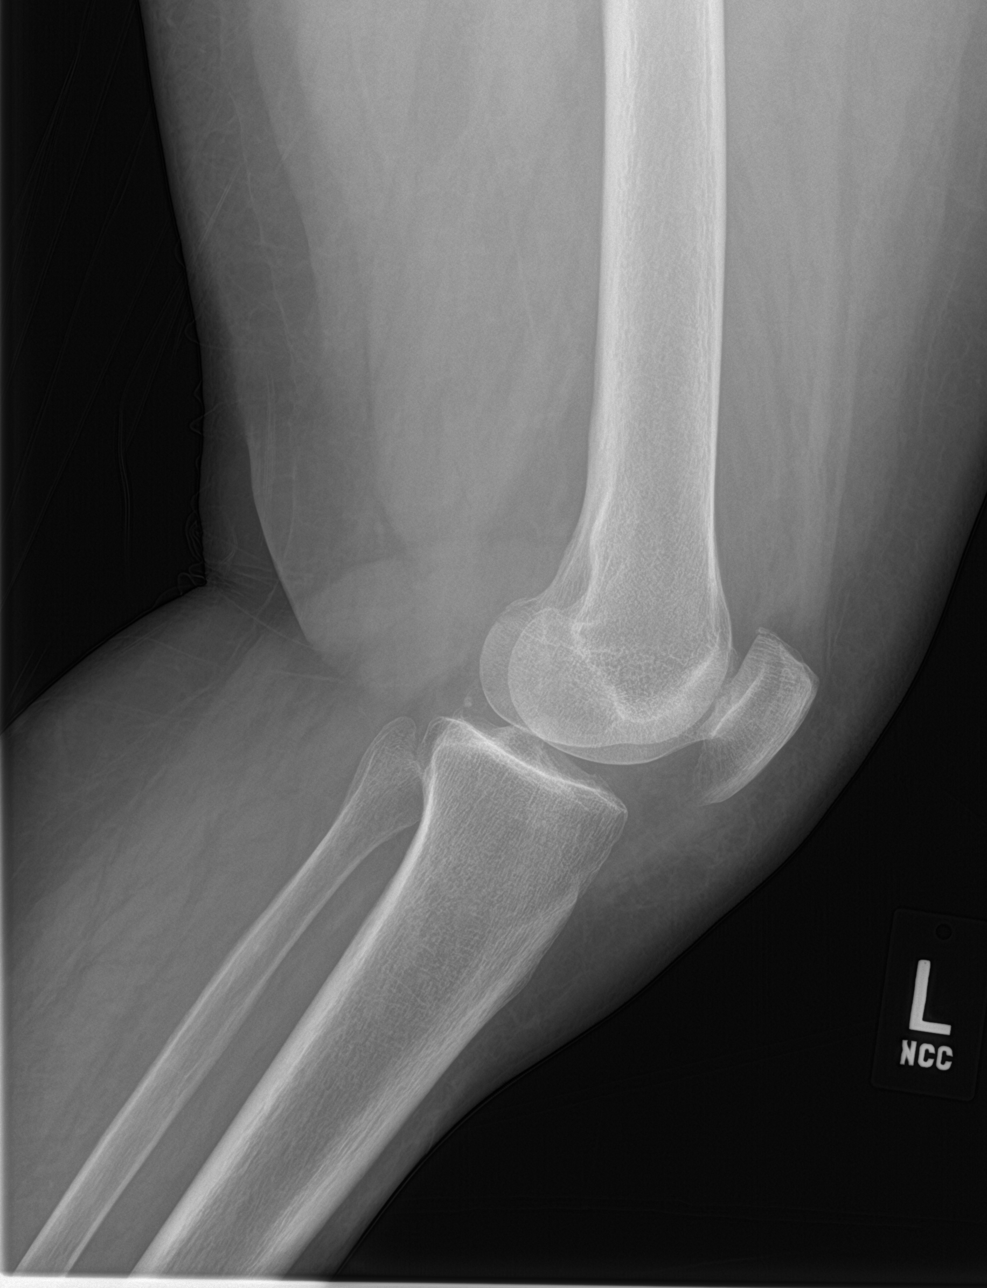

[knee obl (1 of 2)]
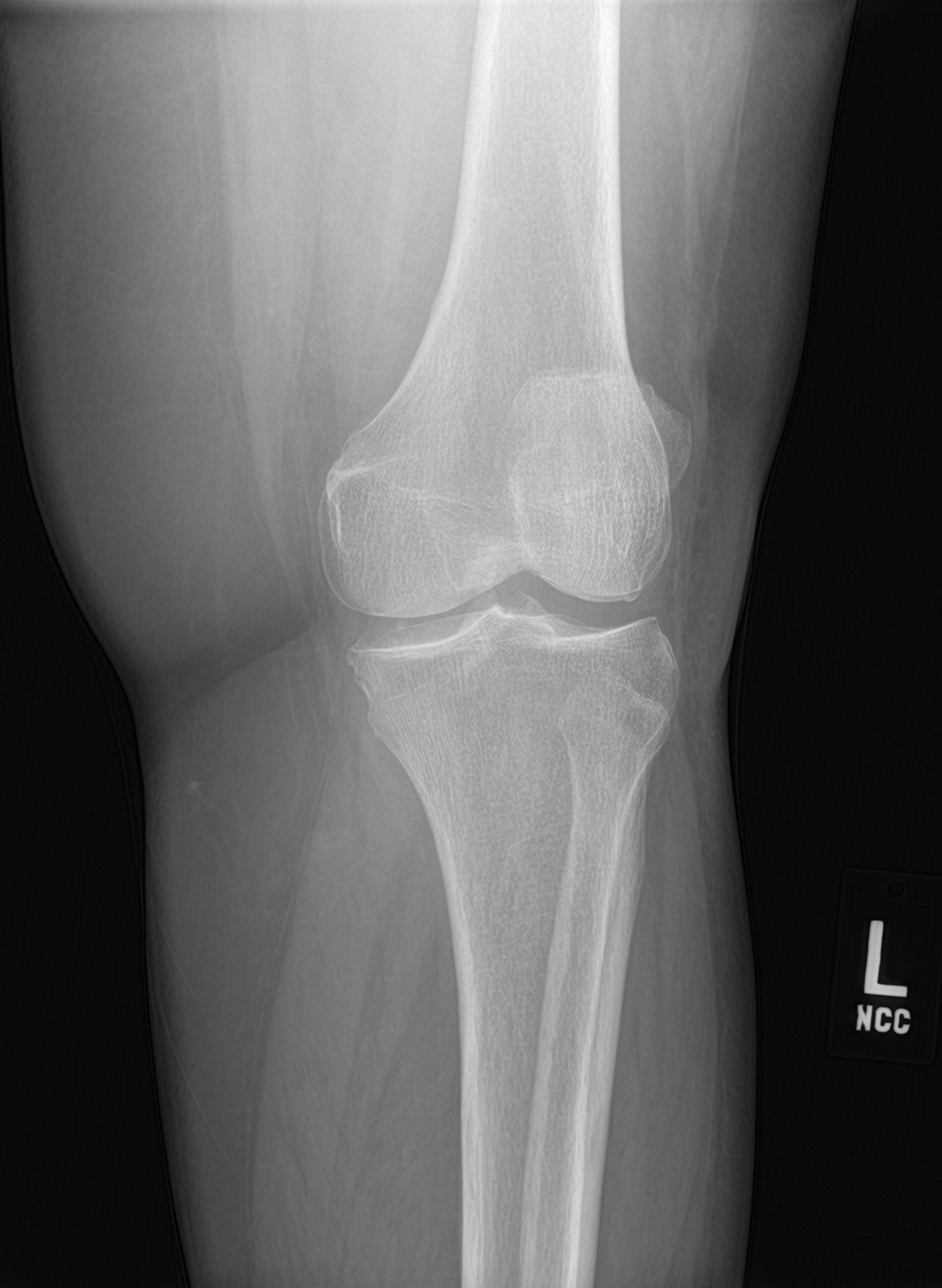

[knee obl (2 of 2)]
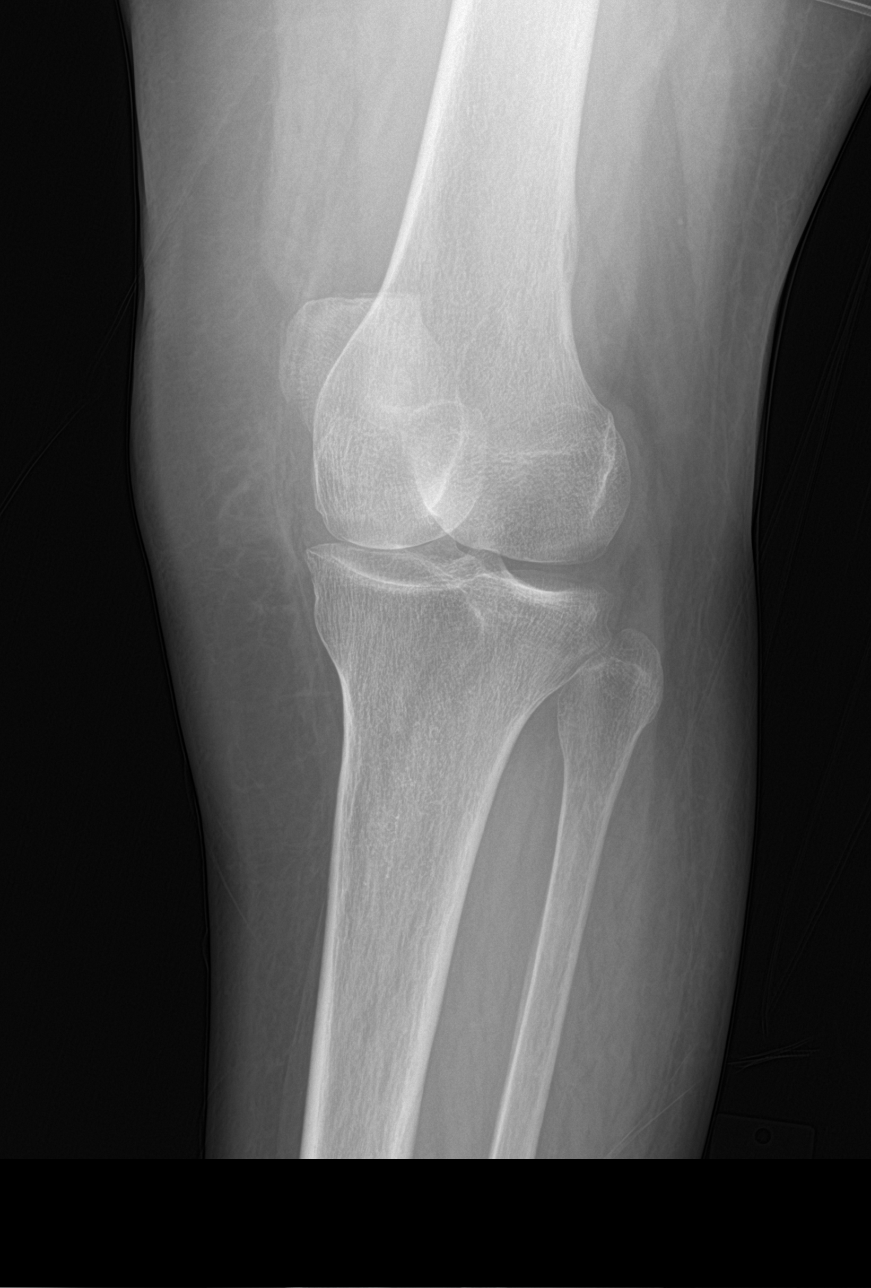

[4 of 4 positions shown; findings below may reference images not displayed]

FINDINGS: No fracture.  No bone lesion.

There is mild sclerosis and irregularity along the dorsal margin of
the patella. This appears degenerative in origin. There is mild
medial compartment narrowing with small medial marginal osteophytes.
No other arthropathic change.

No joint effusion.

Soft tissues are unremarkable.
IMPRESSION: No fracture or acute finding.

## 2018-03-25 ENCOUNTER — Other Ambulatory Visit: Payer: Self-pay | Admitting: Internal Medicine

## 2018-03-25 DIAGNOSIS — Z1231 Encounter for screening mammogram for malignant neoplasm of breast: Secondary | ICD-10-CM

## 2018-04-18 ENCOUNTER — Ambulatory Visit
Admission: RE | Admit: 2018-04-18 | Discharge: 2018-04-18 | Disposition: A | Discharge status: Home / Self Care | Payer: Medicare Other | Source: Ambulatory Visit | Attending: Internal Medicine | Admitting: Internal Medicine

## 2018-04-18 DIAGNOSIS — Z1231 Encounter for screening mammogram for malignant neoplasm of breast: Secondary | ICD-10-CM

## 2018-04-29 DIAGNOSIS — H26491 Other secondary cataract, right eye: Secondary | ICD-10-CM | POA: Diagnosis not present

## 2018-05-20 DIAGNOSIS — S0501XA Injury of conjunctiva and corneal abrasion without foreign body, right eye, initial encounter: Secondary | ICD-10-CM | POA: Diagnosis not present

## 2018-05-23 DIAGNOSIS — S0501XD Injury of conjunctiva and corneal abrasion without foreign body, right eye, subsequent encounter: Secondary | ICD-10-CM | POA: Diagnosis not present

## 2018-07-10 DIAGNOSIS — Z7984 Long term (current) use of oral hypoglycemic drugs: Secondary | ICD-10-CM | POA: Diagnosis not present

## 2018-07-10 DIAGNOSIS — F324 Major depressive disorder, single episode, in partial remission: Secondary | ICD-10-CM | POA: Diagnosis not present

## 2018-07-10 DIAGNOSIS — E113293 Type 2 diabetes mellitus with mild nonproliferative diabetic retinopathy without macular edema, bilateral: Secondary | ICD-10-CM | POA: Diagnosis not present

## 2018-07-10 DIAGNOSIS — K5900 Constipation, unspecified: Secondary | ICD-10-CM | POA: Diagnosis not present

## 2018-07-10 DIAGNOSIS — E1165 Type 2 diabetes mellitus with hyperglycemia: Secondary | ICD-10-CM | POA: Diagnosis not present

## 2018-07-10 DIAGNOSIS — I10 Essential (primary) hypertension: Secondary | ICD-10-CM | POA: Diagnosis not present

## 2018-08-15 DIAGNOSIS — H401131 Primary open-angle glaucoma, bilateral, mild stage: Secondary | ICD-10-CM | POA: Diagnosis not present

## 2018-08-16 DIAGNOSIS — I10 Essential (primary) hypertension: Secondary | ICD-10-CM | POA: Diagnosis not present

## 2018-08-16 DIAGNOSIS — E119 Type 2 diabetes mellitus without complications: Secondary | ICD-10-CM | POA: Diagnosis not present

## 2018-08-16 DIAGNOSIS — Z7984 Long term (current) use of oral hypoglycemic drugs: Secondary | ICD-10-CM | POA: Diagnosis not present

## 2018-08-16 DIAGNOSIS — F324 Major depressive disorder, single episode, in partial remission: Secondary | ICD-10-CM | POA: Diagnosis not present

## 2018-08-16 DIAGNOSIS — H409 Unspecified glaucoma: Secondary | ICD-10-CM | POA: Diagnosis not present

## 2018-09-11 DIAGNOSIS — Z23 Encounter for immunization: Secondary | ICD-10-CM | POA: Diagnosis not present

## 2018-09-11 DIAGNOSIS — E1169 Type 2 diabetes mellitus with other specified complication: Secondary | ICD-10-CM | POA: Diagnosis not present

## 2018-09-11 DIAGNOSIS — F324 Major depressive disorder, single episode, in partial remission: Secondary | ICD-10-CM | POA: Diagnosis not present

## 2018-09-11 DIAGNOSIS — I10 Essential (primary) hypertension: Secondary | ICD-10-CM | POA: Diagnosis not present

## 2018-09-11 DIAGNOSIS — H409 Unspecified glaucoma: Secondary | ICD-10-CM | POA: Diagnosis not present

## 2018-10-03 DIAGNOSIS — E119 Type 2 diabetes mellitus without complications: Secondary | ICD-10-CM | POA: Diagnosis not present

## 2018-10-03 DIAGNOSIS — E1169 Type 2 diabetes mellitus with other specified complication: Secondary | ICD-10-CM | POA: Diagnosis not present

## 2018-10-03 DIAGNOSIS — F324 Major depressive disorder, single episode, in partial remission: Secondary | ICD-10-CM | POA: Diagnosis not present

## 2018-10-03 DIAGNOSIS — H409 Unspecified glaucoma: Secondary | ICD-10-CM | POA: Diagnosis not present

## 2018-10-03 DIAGNOSIS — I10 Essential (primary) hypertension: Secondary | ICD-10-CM | POA: Diagnosis not present

## 2018-10-12 ENCOUNTER — Ambulatory Visit (HOSPITAL_COMMUNITY)
Admission: EM | Admit: 2018-10-12 | Discharge: 2018-10-12 | Disposition: A | Discharge status: Home / Self Care | Payer: Medicare Other | Attending: Family Medicine | Admitting: Family Medicine

## 2018-10-12 ENCOUNTER — Encounter (HOSPITAL_COMMUNITY): Payer: Self-pay | Admitting: Emergency Medicine

## 2018-10-12 ENCOUNTER — Other Ambulatory Visit: Payer: Self-pay

## 2018-10-12 DIAGNOSIS — J029 Acute pharyngitis, unspecified: Secondary | ICD-10-CM | POA: Diagnosis not present

## 2018-10-12 MED ORDER — CETIRIZINE HCL 5 MG PO TABS: 5.0000 mg | ORAL_TABLET | Freq: Every day | ORAL | 0 refills | Status: AC

## 2018-10-12 MED ORDER — IPRATROPIUM BROMIDE 0.06 % NA SOLN: 2.0000 | Freq: Four times a day (QID) | NASAL | 0 refills | Status: AC

## 2018-10-12 MED ORDER — LIDOCAINE VISCOUS HCL 2 % MT SOLN: OROMUCOSAL | 0 refills | Status: AC

## 2018-10-12 NOTE — Discharge Instructions (Signed)
Symptoms are most likely due to viral illness/ drainage down your throat. Start lidocaine for sore throat, do not eat or drink for the next 40 mins after use as it can stunt your gag reflex. Atrovent, Zyrtec for nasal congestion/drainage. You can use over the counter nasal saline rinse such as neti pot for nasal congestion. Monitor for any worsening of symptoms, swelling of the throat, trouble breathing, trouble swallowing, leaning forward to breath, drooling, go to the emergency department for further evaluation needed.  For sore throat/cough try using a honey-based tea. Use 3 teaspoons of honey with juice squeezed from half lemon. Place shaved pieces of ginger into 1/2-1 cup of water and warm over stove top. Then mix the ingredients and repeat every 4 hours as needed.

## 2018-10-12 NOTE — ED Provider Notes (Signed)
[facility]    CSN: [id number] Arrival date & time: 10/12/18  1311     History   Chief Complaint Chief Complaint  Patient presents with  . Sore Throat    HPI Traci Morales is a 67 y.o. female.   67 year old female comes in for 1 week history of sore throat. States has been gradually worsening through the week. Sneezing with rhinorrhea. No nasal congestion, cough. Denies fever, chills, night sweats. Hot beverages without relief. Former smoker.      Past Medical History:  Diagnosis Date  . Anxiety   . Depression   . Diabetes mellitus without complication ([place])   . Hyperlipemia   . Hypertension   . Nasal congestion 08/25/15   per patient she has environmental allergies  . PONV (postoperative nausea and vomiting)    nausea only    There are no active problems to display for this patient.   Past Surgical History:  Procedure Laterality Date  . DILATATION & CURETTAGE/HYSTEROSCOPY WITH MYOSURE N/A 08/26/2015   Procedure: DILATATION & CURETTAGE/HYSTEROSCOPY, REMOVAL OF ENDOMETRIAL MASS WITH MYOSURE;  Surgeon: Christophe Louis, MD;  Location: [facility];  Service: Gynecology;  Laterality: N/A;  . PILONIDAL CYST EXCISION      OB History   None      Home Medications    Prior to Admission medications   Medication Sig Start Date End Date Taking? Authorizing Provider  amLODipine (NORVASC) 10 MG tablet Take 10 mg by mouth daily.  08/17/15  Yes [provider]  dorzolamide (TRUSOPT) 2 % ophthalmic solution Place 1 drop into both eyes daily. 08/09/15  Yes [provider]  fluocinonide (LIDEX) 0.05 % external solution Apply 1 application topically once a week. 07/08/15  Yes [provider]  FLUoxetine (PROZAC) 20 MG capsule Take 1 capsule by mouth daily. 08/19/15  Yes [provider]  glimepiride (AMARYL) 4 MG tablet Take 2 mg by mouth daily. 08/19/15  Yes [provider]  JANUVIA 100 MG tablet Take 1 tablet by mouth daily. 07/29/15  Yes  [provider]  lisinopril-hydrochlorothiazide (PRINZIDE,ZESTORETIC) 20-25 MG per tablet Take 1 tablet by mouth daily. 07/21/15  Yes [provider]  LORazepam (ATIVAN) 1 MG tablet Take 1 mg by mouth at bedtime. 07/30/15  Yes [provider]  metFORMIN (GLUCOPHAGE-XR) 500 MG 24 hr tablet Take 4 tablets by mouth daily. Pt prefers to take all 4 tablets at same time 08/02/15  Yes [provider]  simvastatin (ZOCOR) 20 MG tablet Take 1 tablet by mouth daily. 07/29/15  Yes [provider]  spironolactone (ALDACTONE) 25 MG tablet Take 1 tablet by mouth daily. 08/19/15  Yes [provider]  TRAVATAN Z 0.004 % SOLN ophthalmic solution Place 1 drop into both eyes at bedtime. 08/09/15  Yes [provider]  cetirizine (ZYRTEC) 5 MG tablet Take 1 tablet (5 mg total) by mouth daily. 10/12/18   Tasia Catchings, Amy V, PA-C  ipratropium (ATROVENT) 0.06 % nasal spray Place 2 sprays into both nostrils 4 (four) times daily. 10/12/18   Tasia Catchings, Amy V, PA-C  lidocaine (XYLOCAINE) 2 % solution 5-15 mL gurgle as needed 10/12/18   Ok Edwards, PA-C    Family History History reviewed. No pertinent family history.  Social History Social History   Tobacco Use  . Smoking status: Former Research scientist (life sciences)  . Smokeless tobacco: Never Used  Substance Use Topics  . Alcohol use: No  . Drug use: No     Allergies  Patient has no known allergies.   Review of Systems Review of Systems  Reason unable to perform ROS: See HPI as above.     Physical Exam Triage Vital Signs ED Triage Vitals  Enc Vitals Group     BP 10/12/18 1350 (!) 158/62     Pulse Rate 10/12/18 1350 79     Resp 10/12/18 1350 18     Temp 10/12/18 1350 98 F (36.7 C)     Temp Source 10/12/18 1350 Oral     SpO2 10/12/18 1350 100 %     Weight --      Height --      Head Circumference --      Peak Flow --      Pain Score 10/12/18 1348 0     Pain Loc --      Pain Edu? --      Excl. in [place]? --    No data  found.  Updated Vital Signs BP (!) 158/62 (BP Location: Right Arm)   Pulse 79   Temp 98 F (36.7 C) (Oral)   Resp 18   SpO2 100%   Physical Exam  Constitutional: She is oriented to person, place, and time. She appears well-developed and well-nourished.  Non-toxic appearance. She does not appear ill. No distress.  HENT:  Head: Normocephalic and atraumatic.  Right Ear: Tympanic membrane, external ear and ear canal normal. Tympanic membrane is not erythematous and not bulging.  Left Ear: Tympanic membrane, external ear and ear canal normal. Tympanic membrane is not erythematous and not bulging.  Nose: Nose normal. Right sinus exhibits no maxillary sinus tenderness and no frontal sinus tenderness. Left sinus exhibits no maxillary sinus tenderness and no frontal sinus tenderness.  Mouth/Throat: Uvula is midline, oropharynx is clear and moist and mucous membranes are normal. No tonsillar exudate.  Eyes: Pupils are equal, round, and reactive to light. Conjunctivae are normal.  Neck: Normal range of motion. Neck supple.  Cardiovascular: Normal rate, regular rhythm and normal heart sounds. Exam reveals no gallop and no friction rub.  No murmur heard. Pulmonary/Chest: Effort normal and breath sounds normal. No respiratory distress. She has no decreased breath sounds. She has no wheezes. She has no rhonchi. She has no rales.  Lymphadenopathy:    She has no cervical adenopathy.  Neurological: She is alert and oriented to person, place, and time.  Skin: Skin is warm and dry.  Psychiatric: She has a normal mood and affect. Her behavior is normal. Judgment normal.     UC Treatments / Results  Labs (all labs ordered are listed, but only abnormal results are displayed) Labs Reviewed - No data to display  EKG None  Radiology No results found.  Procedures Procedures (including critical care time)  Medications Ordered in UC Medications - No data to display  Initial Impression / Assessment  and Plan / UC Course  I have reviewed the triage vital signs and the nursing notes.  Pertinent labs & imaging results that were available during my care of the patient were reviewed by me and considered in my medical decision making (see chart for details).    Exam unremarkable. Discussed symptoms more likely due to allergies/post nasal drip. Offered rapid strep, patient would like to defer for now. Symptomatic treatment. Push fluids. Return precautions given.  Final Clinical Impressions(s) / UC Diagnoses   Final diagnoses:  Pharyngitis, unspecified etiology    ED Prescriptions    Medication Sig Dispense Auth. Provider   cetirizine (ZYRTEC)  5 MG tablet Take 1 tablet (5 mg total) by mouth daily. 15 tablet Yu, Amy V, PA-C   ipratropium (ATROVENT) 0.06 % nasal spray Place 2 sprays into both nostrils 4 (four) times daily. 15 mL Yu, Amy V, PA-C   lidocaine (XYLOCAINE) 2 % solution 5-15 mL gurgle as needed 150 mL Tobin Chad, [place] 10/12/18 1459

## 2018-10-12 NOTE — ED Triage Notes (Signed)
The patient presented to the [facility] with a complaint of a sore throat x 1 week. The patient denied any known fever.

## 2018-10-31 DIAGNOSIS — I10 Essential (primary) hypertension: Secondary | ICD-10-CM | POA: Diagnosis not present

## 2018-10-31 DIAGNOSIS — H409 Unspecified glaucoma: Secondary | ICD-10-CM | POA: Diagnosis not present

## 2018-10-31 DIAGNOSIS — F324 Major depressive disorder, single episode, in partial remission: Secondary | ICD-10-CM | POA: Diagnosis not present

## 2018-10-31 DIAGNOSIS — E119 Type 2 diabetes mellitus without complications: Secondary | ICD-10-CM | POA: Diagnosis not present

## 2018-10-31 DIAGNOSIS — Z7984 Long term (current) use of oral hypoglycemic drugs: Secondary | ICD-10-CM | POA: Diagnosis not present

## 2018-10-31 DIAGNOSIS — E1169 Type 2 diabetes mellitus with other specified complication: Secondary | ICD-10-CM | POA: Diagnosis not present

## 2018-11-19 DIAGNOSIS — H401131 Primary open-angle glaucoma, bilateral, mild stage: Secondary | ICD-10-CM | POA: Diagnosis not present

## 2018-12-03 DIAGNOSIS — H04123 Dry eye syndrome of bilateral lacrimal glands: Secondary | ICD-10-CM | POA: Diagnosis not present

## 2018-12-09 DIAGNOSIS — I10 Essential (primary) hypertension: Secondary | ICD-10-CM | POA: Diagnosis not present

## 2018-12-09 DIAGNOSIS — F324 Major depressive disorder, single episode, in partial remission: Secondary | ICD-10-CM | POA: Diagnosis not present

## 2018-12-09 DIAGNOSIS — E1169 Type 2 diabetes mellitus with other specified complication: Secondary | ICD-10-CM | POA: Diagnosis not present

## 2018-12-16 DIAGNOSIS — E119 Type 2 diabetes mellitus without complications: Secondary | ICD-10-CM | POA: Diagnosis not present

## 2018-12-16 DIAGNOSIS — E1169 Type 2 diabetes mellitus with other specified complication: Secondary | ICD-10-CM | POA: Diagnosis not present

## 2018-12-16 DIAGNOSIS — I10 Essential (primary) hypertension: Secondary | ICD-10-CM | POA: Diagnosis not present

## 2018-12-16 DIAGNOSIS — F324 Major depressive disorder, single episode, in partial remission: Secondary | ICD-10-CM | POA: Diagnosis not present

## 2018-12-16 DIAGNOSIS — H409 Unspecified glaucoma: Secondary | ICD-10-CM | POA: Diagnosis not present

## 2019-01-16 DIAGNOSIS — F324 Major depressive disorder, single episode, in partial remission: Secondary | ICD-10-CM | POA: Diagnosis not present

## 2019-01-16 DIAGNOSIS — E1169 Type 2 diabetes mellitus with other specified complication: Secondary | ICD-10-CM | POA: Diagnosis not present

## 2019-01-16 DIAGNOSIS — Z7984 Long term (current) use of oral hypoglycemic drugs: Secondary | ICD-10-CM | POA: Diagnosis not present

## 2019-01-16 DIAGNOSIS — I10 Essential (primary) hypertension: Secondary | ICD-10-CM | POA: Diagnosis not present

## 2019-01-16 DIAGNOSIS — E119 Type 2 diabetes mellitus without complications: Secondary | ICD-10-CM | POA: Diagnosis not present

## 2019-01-16 DIAGNOSIS — H409 Unspecified glaucoma: Secondary | ICD-10-CM | POA: Diagnosis not present

## 2019-02-10 ENCOUNTER — Other Ambulatory Visit: Payer: Self-pay | Admitting: Internal Medicine

## 2019-02-11 DIAGNOSIS — H409 Unspecified glaucoma: Secondary | ICD-10-CM | POA: Diagnosis not present

## 2019-02-11 DIAGNOSIS — E1169 Type 2 diabetes mellitus with other specified complication: Secondary | ICD-10-CM | POA: Diagnosis not present

## 2019-02-11 DIAGNOSIS — I10 Essential (primary) hypertension: Secondary | ICD-10-CM | POA: Diagnosis not present

## 2019-02-11 DIAGNOSIS — E119 Type 2 diabetes mellitus without complications: Secondary | ICD-10-CM | POA: Diagnosis not present

## 2019-02-11 DIAGNOSIS — F324 Major depressive disorder, single episode, in partial remission: Secondary | ICD-10-CM | POA: Diagnosis not present

## 2019-02-11 DIAGNOSIS — Z7984 Long term (current) use of oral hypoglycemic drugs: Secondary | ICD-10-CM | POA: Diagnosis not present

## 2019-02-18 DIAGNOSIS — H401131 Primary open-angle glaucoma, bilateral, mild stage: Secondary | ICD-10-CM | POA: Diagnosis not present

## 2019-03-06 DIAGNOSIS — E119 Type 2 diabetes mellitus without complications: Secondary | ICD-10-CM | POA: Diagnosis not present

## 2019-03-06 DIAGNOSIS — E1169 Type 2 diabetes mellitus with other specified complication: Secondary | ICD-10-CM | POA: Diagnosis not present

## 2019-03-06 DIAGNOSIS — Z7984 Long term (current) use of oral hypoglycemic drugs: Secondary | ICD-10-CM | POA: Diagnosis not present

## 2019-03-06 DIAGNOSIS — I10 Essential (primary) hypertension: Secondary | ICD-10-CM | POA: Diagnosis not present

## 2019-03-06 DIAGNOSIS — F324 Major depressive disorder, single episode, in partial remission: Secondary | ICD-10-CM | POA: Diagnosis not present

## 2019-03-06 DIAGNOSIS — H409 Unspecified glaucoma: Secondary | ICD-10-CM | POA: Diagnosis not present

## 2019-04-09 DIAGNOSIS — E1169 Type 2 diabetes mellitus with other specified complication: Secondary | ICD-10-CM | POA: Diagnosis not present

## 2019-04-09 DIAGNOSIS — E7800 Pure hypercholesterolemia, unspecified: Secondary | ICD-10-CM | POA: Diagnosis not present

## 2019-04-09 DIAGNOSIS — M810 Age-related osteoporosis without current pathological fracture: Secondary | ICD-10-CM | POA: Diagnosis not present

## 2019-04-09 DIAGNOSIS — I10 Essential (primary) hypertension: Secondary | ICD-10-CM | POA: Diagnosis not present

## 2019-04-09 DIAGNOSIS — Z0000 Encounter for general adult medical examination without abnormal findings: Secondary | ICD-10-CM | POA: Diagnosis not present

## 2019-04-09 DIAGNOSIS — Z1389 Encounter for screening for other disorder: Secondary | ICD-10-CM | POA: Diagnosis not present

## 2019-04-09 DIAGNOSIS — E669 Obesity, unspecified: Secondary | ICD-10-CM | POA: Diagnosis not present

## 2019-04-09 DIAGNOSIS — F324 Major depressive disorder, single episode, in partial remission: Secondary | ICD-10-CM | POA: Diagnosis not present

## 2019-04-22 DIAGNOSIS — Z7984 Long term (current) use of oral hypoglycemic drugs: Secondary | ICD-10-CM | POA: Diagnosis not present

## 2019-04-22 DIAGNOSIS — E1169 Type 2 diabetes mellitus with other specified complication: Secondary | ICD-10-CM | POA: Diagnosis not present

## 2019-04-22 DIAGNOSIS — E7800 Pure hypercholesterolemia, unspecified: Secondary | ICD-10-CM | POA: Diagnosis not present

## 2019-05-16 DIAGNOSIS — Z7984 Long term (current) use of oral hypoglycemic drugs: Secondary | ICD-10-CM | POA: Diagnosis not present

## 2019-05-16 DIAGNOSIS — F324 Major depressive disorder, single episode, in partial remission: Secondary | ICD-10-CM | POA: Diagnosis not present

## 2019-05-16 DIAGNOSIS — E7800 Pure hypercholesterolemia, unspecified: Secondary | ICD-10-CM | POA: Diagnosis not present

## 2019-05-16 DIAGNOSIS — H409 Unspecified glaucoma: Secondary | ICD-10-CM | POA: Diagnosis not present

## 2019-05-16 DIAGNOSIS — E1169 Type 2 diabetes mellitus with other specified complication: Secondary | ICD-10-CM | POA: Diagnosis not present

## 2019-05-16 DIAGNOSIS — M8580 Other specified disorders of bone density and structure, unspecified site: Secondary | ICD-10-CM | POA: Diagnosis not present

## 2019-05-16 DIAGNOSIS — M810 Age-related osteoporosis without current pathological fracture: Secondary | ICD-10-CM | POA: Diagnosis not present

## 2019-05-16 DIAGNOSIS — I10 Essential (primary) hypertension: Secondary | ICD-10-CM | POA: Diagnosis not present

## 2019-05-23 ENCOUNTER — Other Ambulatory Visit: Payer: Self-pay | Admitting: Internal Medicine

## 2019-05-23 DIAGNOSIS — Z1231 Encounter for screening mammogram for malignant neoplasm of breast: Secondary | ICD-10-CM

## 2019-06-03 DIAGNOSIS — Z7253 High risk bisexual behavior: Secondary | ICD-10-CM | POA: Diagnosis not present

## 2019-06-03 DIAGNOSIS — N842 Polyp of vagina: Secondary | ICD-10-CM | POA: Diagnosis not present

## 2019-06-03 DIAGNOSIS — Z9189 Other specified personal risk factors, not elsewhere classified: Secondary | ICD-10-CM | POA: Diagnosis not present

## 2019-06-17 DIAGNOSIS — H409 Unspecified glaucoma: Secondary | ICD-10-CM | POA: Diagnosis not present

## 2019-06-17 DIAGNOSIS — E1169 Type 2 diabetes mellitus with other specified complication: Secondary | ICD-10-CM | POA: Diagnosis not present

## 2019-06-17 DIAGNOSIS — E7800 Pure hypercholesterolemia, unspecified: Secondary | ICD-10-CM | POA: Diagnosis not present

## 2019-06-17 DIAGNOSIS — M810 Age-related osteoporosis without current pathological fracture: Secondary | ICD-10-CM | POA: Diagnosis not present

## 2019-06-17 DIAGNOSIS — F324 Major depressive disorder, single episode, in partial remission: Secondary | ICD-10-CM | POA: Diagnosis not present

## 2019-06-17 DIAGNOSIS — M8580 Other specified disorders of bone density and structure, unspecified site: Secondary | ICD-10-CM | POA: Diagnosis not present

## 2019-06-17 DIAGNOSIS — Z794 Long term (current) use of insulin: Secondary | ICD-10-CM | POA: Diagnosis not present

## 2019-06-17 DIAGNOSIS — I10 Essential (primary) hypertension: Secondary | ICD-10-CM | POA: Diagnosis not present

## 2019-07-11 ENCOUNTER — Other Ambulatory Visit: Payer: Self-pay

## 2019-07-11 ENCOUNTER — Ambulatory Visit
Admission: RE | Admit: 2019-07-11 | Discharge: 2019-07-11 | Disposition: A | Discharge status: Home / Self Care | Payer: Medicare Other | Source: Ambulatory Visit | Attending: Internal Medicine | Admitting: Internal Medicine

## 2019-07-11 DIAGNOSIS — Z1231 Encounter for screening mammogram for malignant neoplasm of breast: Secondary | ICD-10-CM

## 2019-07-18 DIAGNOSIS — M810 Age-related osteoporosis without current pathological fracture: Secondary | ICD-10-CM | POA: Diagnosis not present

## 2019-07-18 DIAGNOSIS — E7800 Pure hypercholesterolemia, unspecified: Secondary | ICD-10-CM | POA: Diagnosis not present

## 2019-07-18 DIAGNOSIS — I10 Essential (primary) hypertension: Secondary | ICD-10-CM | POA: Diagnosis not present

## 2019-07-18 DIAGNOSIS — E1169 Type 2 diabetes mellitus with other specified complication: Secondary | ICD-10-CM | POA: Diagnosis not present

## 2019-07-18 DIAGNOSIS — F324 Major depressive disorder, single episode, in partial remission: Secondary | ICD-10-CM | POA: Diagnosis not present

## 2019-07-18 DIAGNOSIS — Z7984 Long term (current) use of oral hypoglycemic drugs: Secondary | ICD-10-CM | POA: Diagnosis not present

## 2019-07-18 DIAGNOSIS — M8580 Other specified disorders of bone density and structure, unspecified site: Secondary | ICD-10-CM | POA: Diagnosis not present

## 2019-07-18 DIAGNOSIS — H409 Unspecified glaucoma: Secondary | ICD-10-CM | POA: Diagnosis not present

## 2019-08-08 DIAGNOSIS — E7800 Pure hypercholesterolemia, unspecified: Secondary | ICD-10-CM | POA: Diagnosis not present

## 2019-08-08 DIAGNOSIS — Z794 Long term (current) use of insulin: Secondary | ICD-10-CM | POA: Diagnosis not present

## 2019-08-08 DIAGNOSIS — H409 Unspecified glaucoma: Secondary | ICD-10-CM | POA: Diagnosis not present

## 2019-08-08 DIAGNOSIS — I10 Essential (primary) hypertension: Secondary | ICD-10-CM | POA: Diagnosis not present

## 2019-08-08 DIAGNOSIS — M8580 Other specified disorders of bone density and structure, unspecified site: Secondary | ICD-10-CM | POA: Diagnosis not present

## 2019-08-08 DIAGNOSIS — E1169 Type 2 diabetes mellitus with other specified complication: Secondary | ICD-10-CM | POA: Diagnosis not present

## 2019-08-08 DIAGNOSIS — F324 Major depressive disorder, single episode, in partial remission: Secondary | ICD-10-CM | POA: Diagnosis not present

## 2019-08-08 DIAGNOSIS — M810 Age-related osteoporosis without current pathological fracture: Secondary | ICD-10-CM | POA: Diagnosis not present

## 2019-09-04 DIAGNOSIS — Z23 Encounter for immunization: Secondary | ICD-10-CM | POA: Diagnosis not present

## 2019-09-15 DIAGNOSIS — H401131 Primary open-angle glaucoma, bilateral, mild stage: Secondary | ICD-10-CM | POA: Diagnosis not present

## 2019-09-17 DIAGNOSIS — H409 Unspecified glaucoma: Secondary | ICD-10-CM | POA: Diagnosis not present

## 2019-09-17 DIAGNOSIS — M810 Age-related osteoporosis without current pathological fracture: Secondary | ICD-10-CM | POA: Diagnosis not present

## 2019-09-17 DIAGNOSIS — M8580 Other specified disorders of bone density and structure, unspecified site: Secondary | ICD-10-CM | POA: Diagnosis not present

## 2019-09-17 DIAGNOSIS — Z7984 Long term (current) use of oral hypoglycemic drugs: Secondary | ICD-10-CM | POA: Diagnosis not present

## 2019-09-17 DIAGNOSIS — E1169 Type 2 diabetes mellitus with other specified complication: Secondary | ICD-10-CM | POA: Diagnosis not present

## 2019-09-17 DIAGNOSIS — F324 Major depressive disorder, single episode, in partial remission: Secondary | ICD-10-CM | POA: Diagnosis not present

## 2019-09-17 DIAGNOSIS — I10 Essential (primary) hypertension: Secondary | ICD-10-CM | POA: Diagnosis not present

## 2019-09-17 DIAGNOSIS — E7800 Pure hypercholesterolemia, unspecified: Secondary | ICD-10-CM | POA: Diagnosis not present

## 2019-10-17 DIAGNOSIS — M8580 Other specified disorders of bone density and structure, unspecified site: Secondary | ICD-10-CM | POA: Diagnosis not present

## 2019-10-17 DIAGNOSIS — E7800 Pure hypercholesterolemia, unspecified: Secondary | ICD-10-CM | POA: Diagnosis not present

## 2019-10-17 DIAGNOSIS — E1169 Type 2 diabetes mellitus with other specified complication: Secondary | ICD-10-CM | POA: Diagnosis not present

## 2019-10-17 DIAGNOSIS — M810 Age-related osteoporosis without current pathological fracture: Secondary | ICD-10-CM | POA: Diagnosis not present

## 2019-10-17 DIAGNOSIS — I10 Essential (primary) hypertension: Secondary | ICD-10-CM | POA: Diagnosis not present

## 2019-10-17 DIAGNOSIS — Z7984 Long term (current) use of oral hypoglycemic drugs: Secondary | ICD-10-CM | POA: Diagnosis not present

## 2019-10-17 DIAGNOSIS — H409 Unspecified glaucoma: Secondary | ICD-10-CM | POA: Diagnosis not present

## 2019-10-17 DIAGNOSIS — F324 Major depressive disorder, single episode, in partial remission: Secondary | ICD-10-CM | POA: Diagnosis not present

## 2019-10-21 DIAGNOSIS — I10 Essential (primary) hypertension: Secondary | ICD-10-CM | POA: Diagnosis not present

## 2019-10-21 DIAGNOSIS — E1169 Type 2 diabetes mellitus with other specified complication: Secondary | ICD-10-CM | POA: Diagnosis not present

## 2019-10-21 DIAGNOSIS — Z23 Encounter for immunization: Secondary | ICD-10-CM | POA: Diagnosis not present

## 2019-10-21 DIAGNOSIS — E669 Obesity, unspecified: Secondary | ICD-10-CM | POA: Diagnosis not present

## 2019-10-28 DIAGNOSIS — E1169 Type 2 diabetes mellitus with other specified complication: Secondary | ICD-10-CM | POA: Diagnosis not present

## 2019-10-28 DIAGNOSIS — H409 Unspecified glaucoma: Secondary | ICD-10-CM | POA: Diagnosis not present

## 2019-10-28 DIAGNOSIS — E7800 Pure hypercholesterolemia, unspecified: Secondary | ICD-10-CM | POA: Diagnosis not present

## 2019-10-28 DIAGNOSIS — F324 Major depressive disorder, single episode, in partial remission: Secondary | ICD-10-CM | POA: Diagnosis not present

## 2019-10-28 DIAGNOSIS — I10 Essential (primary) hypertension: Secondary | ICD-10-CM | POA: Diagnosis not present

## 2019-10-28 DIAGNOSIS — M810 Age-related osteoporosis without current pathological fracture: Secondary | ICD-10-CM | POA: Diagnosis not present

## 2019-10-28 DIAGNOSIS — M8580 Other specified disorders of bone density and structure, unspecified site: Secondary | ICD-10-CM | POA: Diagnosis not present

## 2020-02-15 ENCOUNTER — Ambulatory Visit: Payer: Medicare Other

## 2020-05-31 ENCOUNTER — Other Ambulatory Visit: Payer: Self-pay | Admitting: Internal Medicine

## 2020-05-31 DIAGNOSIS — Z1231 Encounter for screening mammogram for malignant neoplasm of breast: Secondary | ICD-10-CM

## 2020-07-15 ENCOUNTER — Other Ambulatory Visit: Payer: Self-pay

## 2020-07-15 ENCOUNTER — Ambulatory Visit
Admission: RE | Admit: 2020-07-15 | Discharge: 2020-07-15 | Disposition: A | Discharge status: Home / Self Care | Payer: Medicare Other | Source: Ambulatory Visit | Attending: Internal Medicine | Admitting: Internal Medicine

## 2020-07-15 DIAGNOSIS — Z1231 Encounter for screening mammogram for malignant neoplasm of breast: Secondary | ICD-10-CM

## 2020-07-20 ENCOUNTER — Other Ambulatory Visit: Payer: Self-pay | Admitting: Internal Medicine

## 2020-07-20 DIAGNOSIS — R928 Other abnormal and inconclusive findings on diagnostic imaging of breast: Secondary | ICD-10-CM

## 2020-07-27 ENCOUNTER — Other Ambulatory Visit: Payer: Self-pay | Admitting: Internal Medicine

## 2020-07-27 DIAGNOSIS — M810 Age-related osteoporosis without current pathological fracture: Secondary | ICD-10-CM

## 2020-07-29 ENCOUNTER — Other Ambulatory Visit: Payer: Self-pay

## 2020-07-29 ENCOUNTER — Other Ambulatory Visit: Payer: Self-pay | Admitting: Internal Medicine

## 2020-07-29 ENCOUNTER — Ambulatory Visit
Admission: RE | Admit: 2020-07-29 | Discharge: 2020-07-29 | Disposition: A | Discharge status: Home / Self Care | Payer: Medicaid Other | Source: Ambulatory Visit | Attending: Internal Medicine | Admitting: Internal Medicine

## 2020-07-29 DIAGNOSIS — R921 Mammographic calcification found on diagnostic imaging of breast: Secondary | ICD-10-CM

## 2020-07-29 DIAGNOSIS — R928 Other abnormal and inconclusive findings on diagnostic imaging of breast: Secondary | ICD-10-CM

## 2020-11-23 ENCOUNTER — Ambulatory Visit
Admission: RE | Admit: 2020-11-23 | Discharge: 2020-11-23 | Disposition: A | Discharge status: Home / Self Care | Payer: Medicaid Other | Source: Ambulatory Visit | Attending: Internal Medicine | Admitting: Internal Medicine

## 2020-11-23 ENCOUNTER — Other Ambulatory Visit: Payer: Self-pay

## 2020-11-23 DIAGNOSIS — M810 Age-related osteoporosis without current pathological fracture: Secondary | ICD-10-CM

## 2021-01-17 DIAGNOSIS — I10 Essential (primary) hypertension: Secondary | ICD-10-CM | POA: Diagnosis not present

## 2021-01-17 DIAGNOSIS — E7800 Pure hypercholesterolemia, unspecified: Secondary | ICD-10-CM | POA: Diagnosis not present

## 2021-01-17 DIAGNOSIS — M8580 Other specified disorders of bone density and structure, unspecified site: Secondary | ICD-10-CM | POA: Diagnosis not present

## 2021-01-17 DIAGNOSIS — H409 Unspecified glaucoma: Secondary | ICD-10-CM | POA: Diagnosis not present

## 2021-01-17 DIAGNOSIS — M810 Age-related osteoporosis without current pathological fracture: Secondary | ICD-10-CM | POA: Diagnosis not present

## 2021-01-17 DIAGNOSIS — G4700 Insomnia, unspecified: Secondary | ICD-10-CM | POA: Diagnosis not present

## 2021-01-17 DIAGNOSIS — E1169 Type 2 diabetes mellitus with other specified complication: Secondary | ICD-10-CM | POA: Diagnosis not present

## 2021-02-08 ENCOUNTER — Other Ambulatory Visit: Payer: Self-pay

## 2021-02-08 ENCOUNTER — Ambulatory Visit
Admission: RE | Admit: 2021-02-08 | Discharge: 2021-02-08 | Disposition: A | Discharge status: Home / Self Care | Payer: Medicaid Other | Source: Ambulatory Visit | Attending: Internal Medicine | Admitting: Internal Medicine

## 2021-02-08 ENCOUNTER — Other Ambulatory Visit: Payer: Self-pay | Admitting: Internal Medicine

## 2021-02-08 DIAGNOSIS — R921 Mammographic calcification found on diagnostic imaging of breast: Secondary | ICD-10-CM

## 2021-03-14 DIAGNOSIS — H409 Unspecified glaucoma: Secondary | ICD-10-CM | POA: Diagnosis not present

## 2021-03-14 DIAGNOSIS — E1169 Type 2 diabetes mellitus with other specified complication: Secondary | ICD-10-CM | POA: Diagnosis not present

## 2021-03-14 DIAGNOSIS — I10 Essential (primary) hypertension: Secondary | ICD-10-CM | POA: Diagnosis not present

## 2021-03-14 DIAGNOSIS — G4700 Insomnia, unspecified: Secondary | ICD-10-CM | POA: Diagnosis not present

## 2021-03-14 DIAGNOSIS — M8580 Other specified disorders of bone density and structure, unspecified site: Secondary | ICD-10-CM | POA: Diagnosis not present

## 2021-03-14 DIAGNOSIS — E7800 Pure hypercholesterolemia, unspecified: Secondary | ICD-10-CM | POA: Diagnosis not present

## 2021-03-14 DIAGNOSIS — M810 Age-related osteoporosis without current pathological fracture: Secondary | ICD-10-CM | POA: Diagnosis not present

## 2021-03-16 DIAGNOSIS — E1169 Type 2 diabetes mellitus with other specified complication: Secondary | ICD-10-CM | POA: Diagnosis not present

## 2021-03-16 DIAGNOSIS — M79645 Pain in left finger(s): Secondary | ICD-10-CM | POA: Diagnosis not present

## 2021-03-16 DIAGNOSIS — I10 Essential (primary) hypertension: Secondary | ICD-10-CM | POA: Diagnosis not present

## 2021-03-22 DIAGNOSIS — H401131 Primary open-angle glaucoma, bilateral, mild stage: Secondary | ICD-10-CM | POA: Diagnosis not present

## 2021-05-17 DIAGNOSIS — E7800 Pure hypercholesterolemia, unspecified: Secondary | ICD-10-CM | POA: Diagnosis not present

## 2021-05-17 DIAGNOSIS — M810 Age-related osteoporosis without current pathological fracture: Secondary | ICD-10-CM | POA: Diagnosis not present

## 2021-05-17 DIAGNOSIS — I10 Essential (primary) hypertension: Secondary | ICD-10-CM | POA: Diagnosis not present

## 2021-05-17 DIAGNOSIS — H409 Unspecified glaucoma: Secondary | ICD-10-CM | POA: Diagnosis not present

## 2021-05-17 DIAGNOSIS — E1169 Type 2 diabetes mellitus with other specified complication: Secondary | ICD-10-CM | POA: Diagnosis not present

## 2021-05-17 DIAGNOSIS — M8580 Other specified disorders of bone density and structure, unspecified site: Secondary | ICD-10-CM | POA: Diagnosis not present

## 2021-05-17 DIAGNOSIS — G4700 Insomnia, unspecified: Secondary | ICD-10-CM | POA: Diagnosis not present

## 2021-06-15 DIAGNOSIS — M810 Age-related osteoporosis without current pathological fracture: Secondary | ICD-10-CM | POA: Diagnosis not present

## 2021-06-15 DIAGNOSIS — E1169 Type 2 diabetes mellitus with other specified complication: Secondary | ICD-10-CM | POA: Diagnosis not present

## 2021-06-15 DIAGNOSIS — I10 Essential (primary) hypertension: Secondary | ICD-10-CM | POA: Diagnosis not present

## 2021-06-15 DIAGNOSIS — E7800 Pure hypercholesterolemia, unspecified: Secondary | ICD-10-CM | POA: Diagnosis not present

## 2021-06-15 DIAGNOSIS — H409 Unspecified glaucoma: Secondary | ICD-10-CM | POA: Diagnosis not present

## 2021-06-15 DIAGNOSIS — G4700 Insomnia, unspecified: Secondary | ICD-10-CM | POA: Diagnosis not present

## 2021-06-15 DIAGNOSIS — M8580 Other specified disorders of bone density and structure, unspecified site: Secondary | ICD-10-CM | POA: Diagnosis not present

## 2021-07-13 DIAGNOSIS — M8580 Other specified disorders of bone density and structure, unspecified site: Secondary | ICD-10-CM | POA: Diagnosis not present

## 2021-07-13 DIAGNOSIS — M810 Age-related osteoporosis without current pathological fracture: Secondary | ICD-10-CM | POA: Diagnosis not present

## 2021-07-13 DIAGNOSIS — G4700 Insomnia, unspecified: Secondary | ICD-10-CM | POA: Diagnosis not present

## 2021-07-13 DIAGNOSIS — E7800 Pure hypercholesterolemia, unspecified: Secondary | ICD-10-CM | POA: Diagnosis not present

## 2021-07-13 DIAGNOSIS — H409 Unspecified glaucoma: Secondary | ICD-10-CM | POA: Diagnosis not present

## 2021-07-13 DIAGNOSIS — I10 Essential (primary) hypertension: Secondary | ICD-10-CM | POA: Diagnosis not present

## 2021-07-13 DIAGNOSIS — E1169 Type 2 diabetes mellitus with other specified complication: Secondary | ICD-10-CM | POA: Diagnosis not present

## 2021-08-09 ENCOUNTER — Ambulatory Visit
Admission: RE | Admit: 2021-08-09 | Discharge: 2021-08-09 | Disposition: A | Discharge status: Home / Self Care | Payer: Medicaid Other | Source: Ambulatory Visit | Attending: Internal Medicine | Admitting: Internal Medicine

## 2021-08-09 ENCOUNTER — Other Ambulatory Visit: Payer: Self-pay

## 2021-08-09 DIAGNOSIS — R921 Mammographic calcification found on diagnostic imaging of breast: Secondary | ICD-10-CM

## 2021-08-09 DIAGNOSIS — R922 Inconclusive mammogram: Secondary | ICD-10-CM | POA: Diagnosis not present

## 2021-08-12 DIAGNOSIS — E1169 Type 2 diabetes mellitus with other specified complication: Secondary | ICD-10-CM | POA: Diagnosis not present

## 2021-08-12 DIAGNOSIS — I10 Essential (primary) hypertension: Secondary | ICD-10-CM | POA: Diagnosis not present

## 2021-08-12 DIAGNOSIS — G4700 Insomnia, unspecified: Secondary | ICD-10-CM | POA: Diagnosis not present

## 2021-08-12 DIAGNOSIS — H409 Unspecified glaucoma: Secondary | ICD-10-CM | POA: Diagnosis not present

## 2021-08-12 DIAGNOSIS — M810 Age-related osteoporosis without current pathological fracture: Secondary | ICD-10-CM | POA: Diagnosis not present

## 2021-08-12 DIAGNOSIS — M8580 Other specified disorders of bone density and structure, unspecified site: Secondary | ICD-10-CM | POA: Diagnosis not present

## 2021-08-12 DIAGNOSIS — E7800 Pure hypercholesterolemia, unspecified: Secondary | ICD-10-CM | POA: Diagnosis not present

## 2021-08-30 ENCOUNTER — Ambulatory Visit
Admission: RE | Admit: 2021-08-30 | Discharge: 2021-08-30 | Disposition: A | Discharge status: Home / Self Care | Payer: Medicare Other | Source: Ambulatory Visit | Attending: Physician Assistant | Admitting: Physician Assistant

## 2021-08-30 ENCOUNTER — Other Ambulatory Visit: Payer: Self-pay | Admitting: Physician Assistant

## 2021-08-30 DIAGNOSIS — S8991XA Unspecified injury of right lower leg, initial encounter: Secondary | ICD-10-CM | POA: Diagnosis not present

## 2021-09-12 DIAGNOSIS — M810 Age-related osteoporosis without current pathological fracture: Secondary | ICD-10-CM | POA: Diagnosis not present

## 2021-09-12 DIAGNOSIS — E1169 Type 2 diabetes mellitus with other specified complication: Secondary | ICD-10-CM | POA: Diagnosis not present

## 2021-09-12 DIAGNOSIS — I10 Essential (primary) hypertension: Secondary | ICD-10-CM | POA: Diagnosis not present

## 2021-09-12 DIAGNOSIS — H409 Unspecified glaucoma: Secondary | ICD-10-CM | POA: Diagnosis not present

## 2021-09-12 DIAGNOSIS — M8580 Other specified disorders of bone density and structure, unspecified site: Secondary | ICD-10-CM | POA: Diagnosis not present

## 2021-09-12 DIAGNOSIS — E7800 Pure hypercholesterolemia, unspecified: Secondary | ICD-10-CM | POA: Diagnosis not present

## 2021-09-12 DIAGNOSIS — G4700 Insomnia, unspecified: Secondary | ICD-10-CM | POA: Diagnosis not present

## 2021-09-20 DIAGNOSIS — H401131 Primary open-angle glaucoma, bilateral, mild stage: Secondary | ICD-10-CM | POA: Diagnosis not present

## 2021-10-10 DIAGNOSIS — H409 Unspecified glaucoma: Secondary | ICD-10-CM | POA: Diagnosis not present

## 2021-10-10 DIAGNOSIS — M810 Age-related osteoporosis without current pathological fracture: Secondary | ICD-10-CM | POA: Diagnosis not present

## 2021-10-10 DIAGNOSIS — G4700 Insomnia, unspecified: Secondary | ICD-10-CM | POA: Diagnosis not present

## 2021-10-10 DIAGNOSIS — E7800 Pure hypercholesterolemia, unspecified: Secondary | ICD-10-CM | POA: Diagnosis not present

## 2021-10-10 DIAGNOSIS — E1169 Type 2 diabetes mellitus with other specified complication: Secondary | ICD-10-CM | POA: Diagnosis not present

## 2021-10-10 DIAGNOSIS — I10 Essential (primary) hypertension: Secondary | ICD-10-CM | POA: Diagnosis not present

## 2021-10-10 DIAGNOSIS — M8580 Other specified disorders of bone density and structure, unspecified site: Secondary | ICD-10-CM | POA: Diagnosis not present

## 2021-11-09 DIAGNOSIS — M8580 Other specified disorders of bone density and structure, unspecified site: Secondary | ICD-10-CM | POA: Diagnosis not present

## 2021-11-09 DIAGNOSIS — E7800 Pure hypercholesterolemia, unspecified: Secondary | ICD-10-CM | POA: Diagnosis not present

## 2021-11-09 DIAGNOSIS — I10 Essential (primary) hypertension: Secondary | ICD-10-CM | POA: Diagnosis not present

## 2021-11-09 DIAGNOSIS — G4700 Insomnia, unspecified: Secondary | ICD-10-CM | POA: Diagnosis not present

## 2021-11-09 DIAGNOSIS — H409 Unspecified glaucoma: Secondary | ICD-10-CM | POA: Diagnosis not present

## 2021-11-09 DIAGNOSIS — E1169 Type 2 diabetes mellitus with other specified complication: Secondary | ICD-10-CM | POA: Diagnosis not present

## 2021-11-09 DIAGNOSIS — M810 Age-related osteoporosis without current pathological fracture: Secondary | ICD-10-CM | POA: Diagnosis not present

## 2021-11-15 DIAGNOSIS — Z9189 Other specified personal risk factors, not elsewhere classified: Secondary | ICD-10-CM | POA: Diagnosis not present

## 2021-11-21 DIAGNOSIS — M25561 Pain in right knee: Secondary | ICD-10-CM | POA: Diagnosis not present

## 2021-11-21 DIAGNOSIS — E7800 Pure hypercholesterolemia, unspecified: Secondary | ICD-10-CM | POA: Diagnosis not present

## 2021-11-21 DIAGNOSIS — M810 Age-related osteoporosis without current pathological fracture: Secondary | ICD-10-CM | POA: Diagnosis not present

## 2021-11-21 DIAGNOSIS — I10 Essential (primary) hypertension: Secondary | ICD-10-CM | POA: Diagnosis not present

## 2021-11-21 DIAGNOSIS — Z0000 Encounter for general adult medical examination without abnormal findings: Secondary | ICD-10-CM | POA: Diagnosis not present

## 2021-11-21 DIAGNOSIS — E1169 Type 2 diabetes mellitus with other specified complication: Secondary | ICD-10-CM | POA: Diagnosis not present

## 2021-11-23 DIAGNOSIS — Z1211 Encounter for screening for malignant neoplasm of colon: Secondary | ICD-10-CM | POA: Diagnosis not present

## 2021-12-08 DIAGNOSIS — E7800 Pure hypercholesterolemia, unspecified: Secondary | ICD-10-CM | POA: Diagnosis not present

## 2021-12-08 DIAGNOSIS — M8580 Other specified disorders of bone density and structure, unspecified site: Secondary | ICD-10-CM | POA: Diagnosis not present

## 2021-12-08 DIAGNOSIS — E1169 Type 2 diabetes mellitus with other specified complication: Secondary | ICD-10-CM | POA: Diagnosis not present

## 2021-12-08 DIAGNOSIS — I10 Essential (primary) hypertension: Secondary | ICD-10-CM | POA: Diagnosis not present

## 2021-12-08 DIAGNOSIS — H409 Unspecified glaucoma: Secondary | ICD-10-CM | POA: Diagnosis not present

## 2021-12-08 DIAGNOSIS — M810 Age-related osteoporosis without current pathological fracture: Secondary | ICD-10-CM | POA: Diagnosis not present

## 2021-12-08 DIAGNOSIS — G4700 Insomnia, unspecified: Secondary | ICD-10-CM | POA: Diagnosis not present

## 2021-12-29 ENCOUNTER — Other Ambulatory Visit: Payer: Self-pay | Admitting: Oral Surgery

## 2021-12-29 DIAGNOSIS — K1329 Other disturbances of oral epithelium, including tongue: Secondary | ICD-10-CM | POA: Diagnosis not present

## 2022-04-25 DIAGNOSIS — H401131 Primary open-angle glaucoma, bilateral, mild stage: Secondary | ICD-10-CM | POA: Diagnosis not present

## 2022-06-16 DIAGNOSIS — E1169 Type 2 diabetes mellitus with other specified complication: Secondary | ICD-10-CM | POA: Diagnosis not present

## 2022-06-16 DIAGNOSIS — I10 Essential (primary) hypertension: Secondary | ICD-10-CM | POA: Diagnosis not present

## 2022-06-16 DIAGNOSIS — M810 Age-related osteoporosis without current pathological fracture: Secondary | ICD-10-CM | POA: Diagnosis not present

## 2022-06-29 IMAGING — MG DIGITAL DIAGNOSTIC BILAT W/ TOMO W/ CAD
8 of 12 series · 8 of 28 positions shown · non-contrast
Comparison: Previous exam(s).

CLINICAL DATA: 69-year-old female presenting for 1 year evaluation
of probably benign right breast calcifications.

EXAM:
DIGITAL DIAGNOSTIC BILATERAL MAMMOGRAM WITH TOMOSYNTHESIS AND CAD
TECHNIQUE: Bilateral digital diagnostic mammography and breast tomosynthesis
was performed. The images were evaluated with computer-aided
detection.

[R ML (1 of 3)]
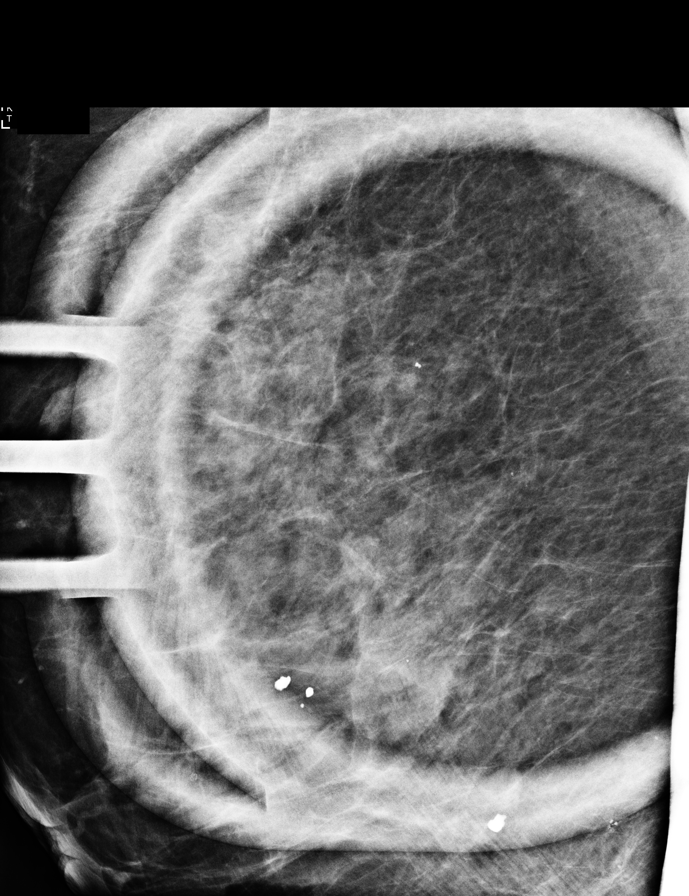

[R ML (2 of 3)]
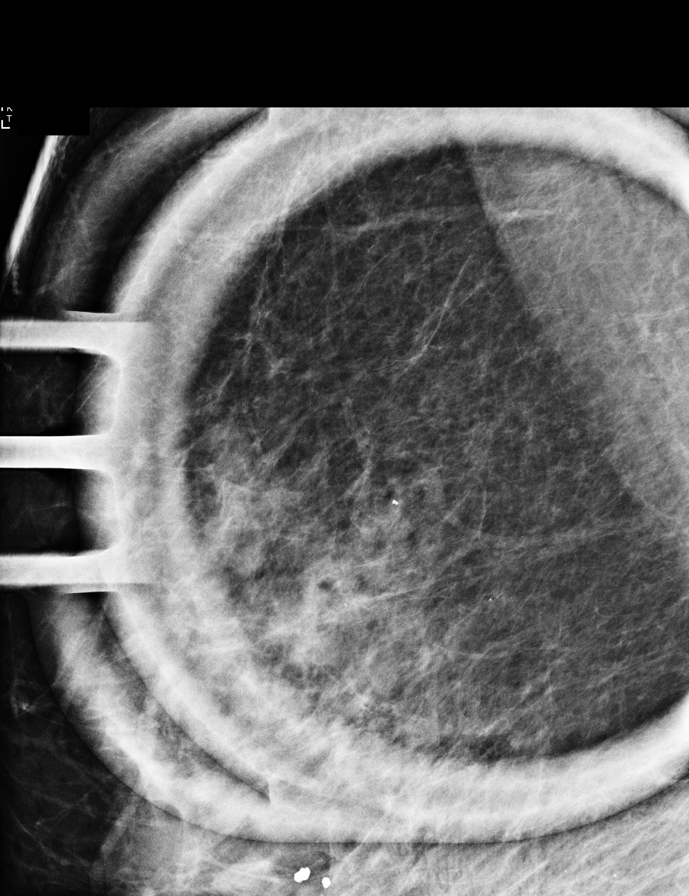

[R CC]
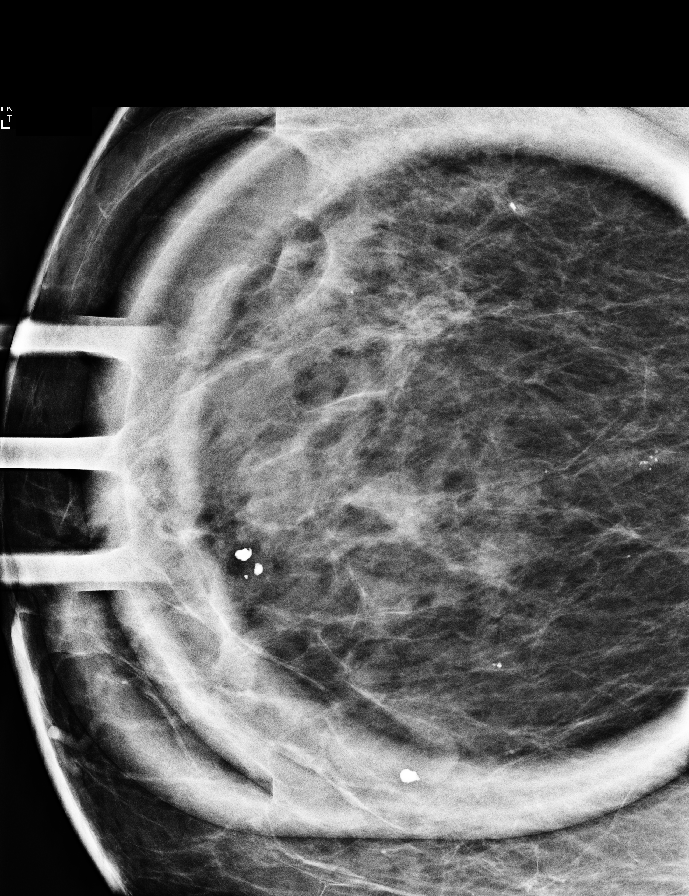

[R ML (3 of 3)]
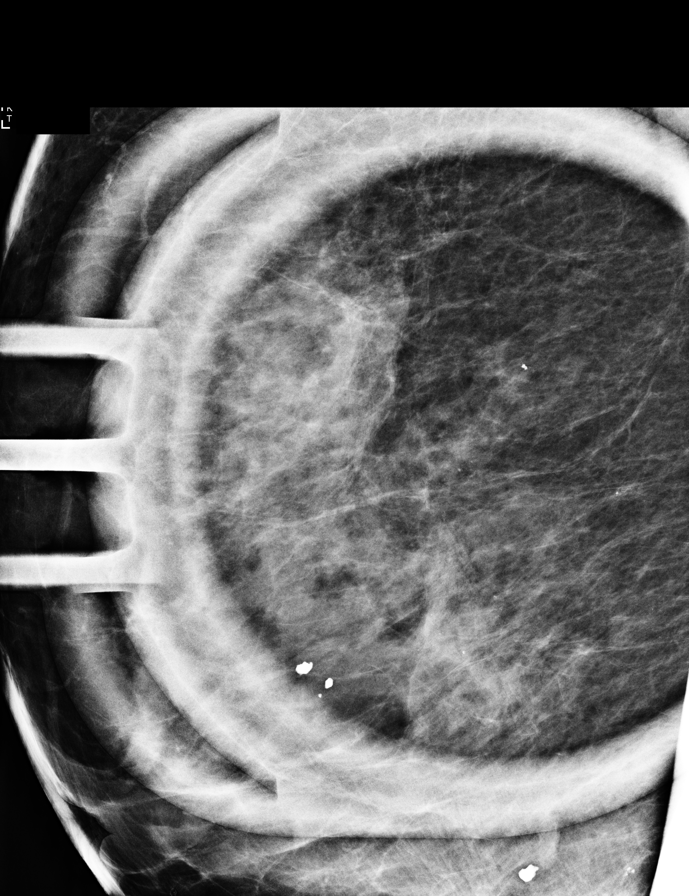

[R MLO synth-2D]
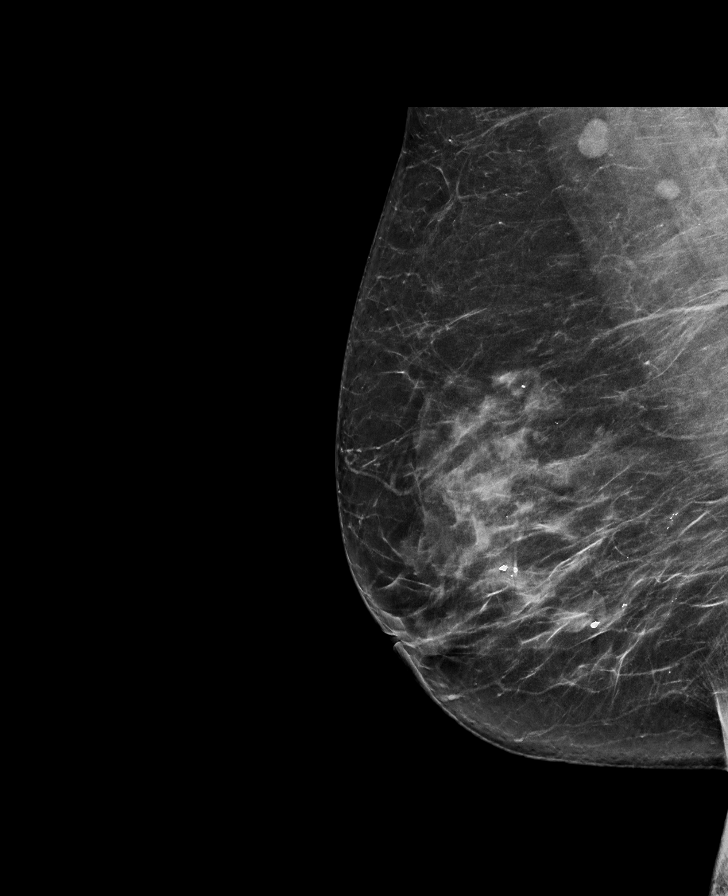

[L MLO synth-2D]
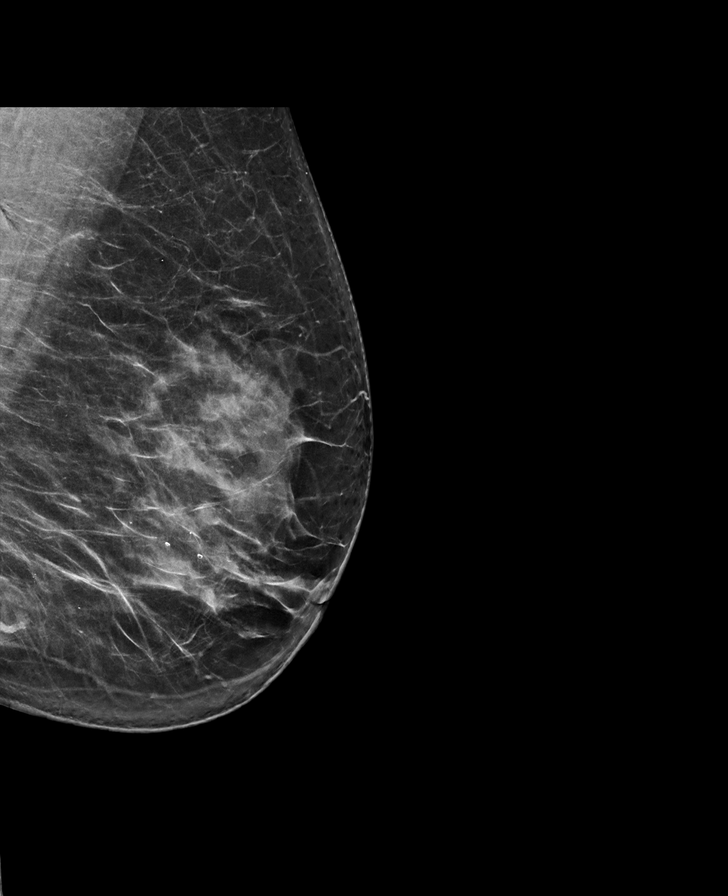

[L CC synth-2D]
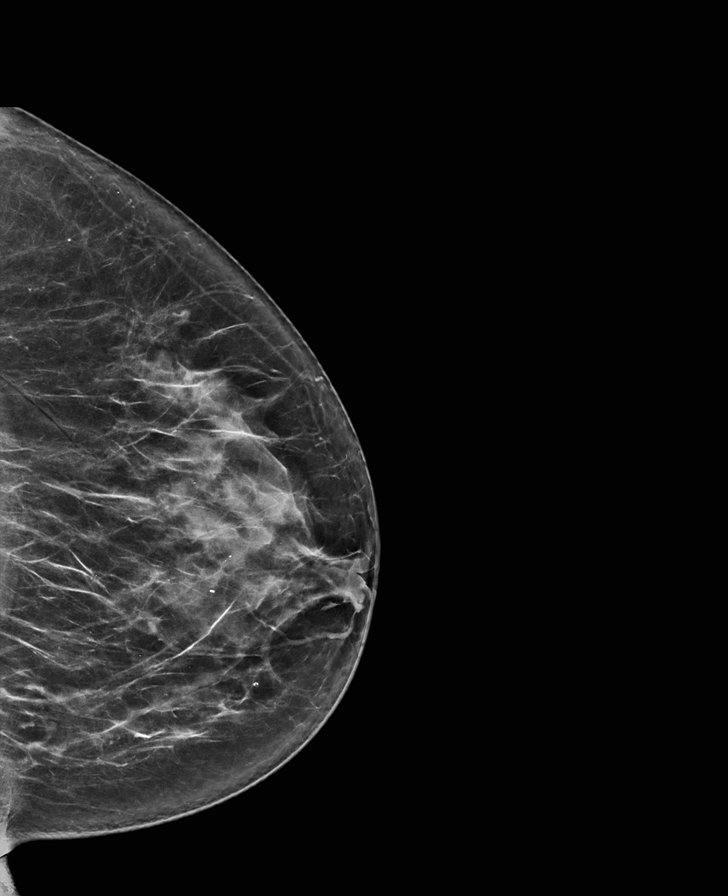

[R CC synth-2D]
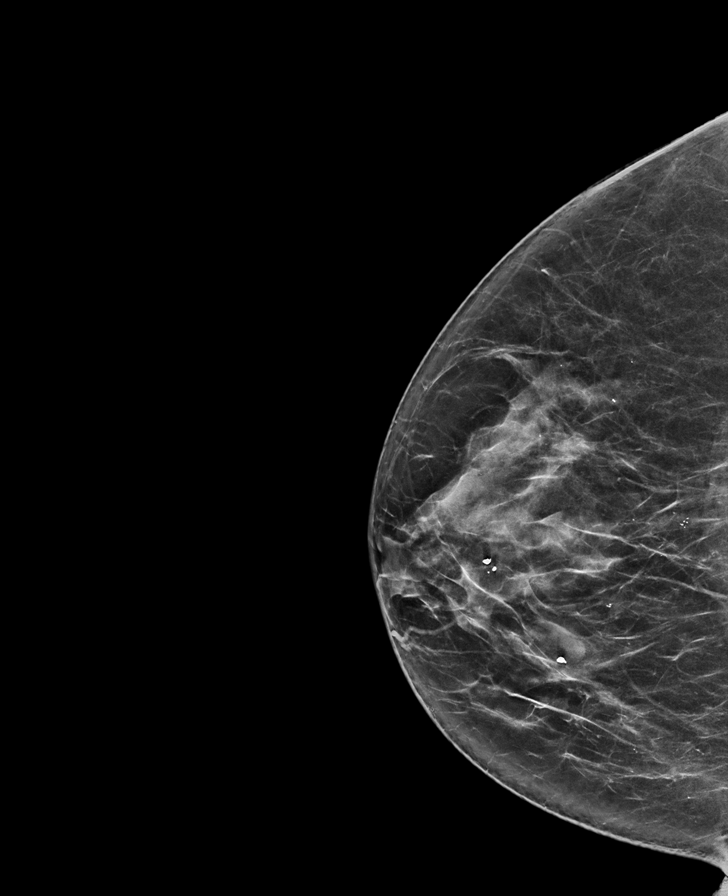

[8 of 28 positions shown; findings below may reference images not displayed]

ACR Breast Density Category c: The breast tissue is heterogeneously
dense, which may obscure small masses.
FINDINGS: Right breast: Spot 2D magnification views of the right breast were
performed in addition to standard views. There is a stable group of
coarse heterogeneous calcifications in the central inferior right
breast posterior depth. There are no new suspicious findings in the
right breast.

Left breast: No suspicious mass, distortion, or microcalcifications
are identified to suggest presence of malignancy.
IMPRESSION: Stable probably benign right breast calcifications. No mammographic
evidence of malignancy in the left breast.

RECOMMENDATION:
Diagnostic bilateral mammogram in 1 year.

I have discussed the findings and recommendations with the patient.
If applicable, a reminder letter will be sent to the patient
regarding the next appointment.

BI-RADS CATEGORY  3: Probably benign.

## 2022-10-16 DIAGNOSIS — E1169 Type 2 diabetes mellitus with other specified complication: Secondary | ICD-10-CM | POA: Diagnosis not present

## 2022-10-16 DIAGNOSIS — E7800 Pure hypercholesterolemia, unspecified: Secondary | ICD-10-CM | POA: Diagnosis not present

## 2022-10-16 DIAGNOSIS — I10 Essential (primary) hypertension: Secondary | ICD-10-CM | POA: Diagnosis not present

## 2022-10-26 DIAGNOSIS — H401131 Primary open-angle glaucoma, bilateral, mild stage: Secondary | ICD-10-CM | POA: Diagnosis not present

## 2022-11-16 DIAGNOSIS — M25512 Pain in left shoulder: Secondary | ICD-10-CM | POA: Diagnosis not present

## 2022-11-16 DIAGNOSIS — M6281 Muscle weakness (generalized): Secondary | ICD-10-CM | POA: Diagnosis not present

## 2022-11-16 DIAGNOSIS — M25612 Stiffness of left shoulder, not elsewhere classified: Secondary | ICD-10-CM | POA: Diagnosis not present

## 2022-11-17 DIAGNOSIS — Z9189 Other specified personal risk factors, not elsewhere classified: Secondary | ICD-10-CM | POA: Diagnosis not present

## 2022-11-20 DIAGNOSIS — M6281 Muscle weakness (generalized): Secondary | ICD-10-CM | POA: Diagnosis not present

## 2022-11-20 DIAGNOSIS — M25612 Stiffness of left shoulder, not elsewhere classified: Secondary | ICD-10-CM | POA: Diagnosis not present

## 2022-11-20 DIAGNOSIS — M25512 Pain in left shoulder: Secondary | ICD-10-CM | POA: Diagnosis not present

## 2022-11-23 DIAGNOSIS — E7800 Pure hypercholesterolemia, unspecified: Secondary | ICD-10-CM | POA: Diagnosis not present

## 2022-11-23 DIAGNOSIS — E1169 Type 2 diabetes mellitus with other specified complication: Secondary | ICD-10-CM | POA: Diagnosis not present

## 2022-11-23 DIAGNOSIS — M810 Age-related osteoporosis without current pathological fracture: Secondary | ICD-10-CM | POA: Diagnosis not present

## 2022-11-23 DIAGNOSIS — Z0000 Encounter for general adult medical examination without abnormal findings: Secondary | ICD-10-CM | POA: Diagnosis not present

## 2022-11-23 DIAGNOSIS — Z1211 Encounter for screening for malignant neoplasm of colon: Secondary | ICD-10-CM | POA: Diagnosis not present

## 2022-11-23 DIAGNOSIS — H409 Unspecified glaucoma: Secondary | ICD-10-CM | POA: Diagnosis not present

## 2022-11-23 DIAGNOSIS — I10 Essential (primary) hypertension: Secondary | ICD-10-CM | POA: Diagnosis not present

## 2022-11-24 DIAGNOSIS — M25612 Stiffness of left shoulder, not elsewhere classified: Secondary | ICD-10-CM | POA: Diagnosis not present

## 2022-11-24 DIAGNOSIS — M6281 Muscle weakness (generalized): Secondary | ICD-10-CM | POA: Diagnosis not present

## 2022-11-24 DIAGNOSIS — M25512 Pain in left shoulder: Secondary | ICD-10-CM | POA: Diagnosis not present

## 2022-11-27 DIAGNOSIS — Z1231 Encounter for screening mammogram for malignant neoplasm of breast: Secondary | ICD-10-CM | POA: Diagnosis not present

## 2022-11-28 DIAGNOSIS — Z1211 Encounter for screening for malignant neoplasm of colon: Secondary | ICD-10-CM | POA: Diagnosis not present

## 2022-11-29 DIAGNOSIS — M25512 Pain in left shoulder: Secondary | ICD-10-CM | POA: Diagnosis not present

## 2022-11-29 DIAGNOSIS — M25612 Stiffness of left shoulder, not elsewhere classified: Secondary | ICD-10-CM | POA: Diagnosis not present

## 2022-11-29 DIAGNOSIS — M6281 Muscle weakness (generalized): Secondary | ICD-10-CM | POA: Diagnosis not present

## 2022-12-04 DIAGNOSIS — M25612 Stiffness of left shoulder, not elsewhere classified: Secondary | ICD-10-CM | POA: Diagnosis not present

## 2022-12-04 DIAGNOSIS — M25512 Pain in left shoulder: Secondary | ICD-10-CM | POA: Diagnosis not present

## 2022-12-04 DIAGNOSIS — M6281 Muscle weakness (generalized): Secondary | ICD-10-CM | POA: Diagnosis not present

## 2022-12-06 DIAGNOSIS — M6281 Muscle weakness (generalized): Secondary | ICD-10-CM | POA: Diagnosis not present

## 2022-12-06 DIAGNOSIS — M25512 Pain in left shoulder: Secondary | ICD-10-CM | POA: Diagnosis not present

## 2022-12-06 DIAGNOSIS — M25612 Stiffness of left shoulder, not elsewhere classified: Secondary | ICD-10-CM | POA: Diagnosis not present

## 2022-12-07 DIAGNOSIS — M85852 Other specified disorders of bone density and structure, left thigh: Secondary | ICD-10-CM | POA: Diagnosis not present

## 2022-12-07 DIAGNOSIS — M810 Age-related osteoporosis without current pathological fracture: Secondary | ICD-10-CM | POA: Diagnosis not present

## 2022-12-07 DIAGNOSIS — Z780 Asymptomatic menopausal state: Secondary | ICD-10-CM | POA: Diagnosis not present

## 2022-12-07 DIAGNOSIS — M85851 Other specified disorders of bone density and structure, right thigh: Secondary | ICD-10-CM | POA: Diagnosis not present

## 2022-12-08 DIAGNOSIS — M6281 Muscle weakness (generalized): Secondary | ICD-10-CM | POA: Diagnosis not present

## 2022-12-08 DIAGNOSIS — M25612 Stiffness of left shoulder, not elsewhere classified: Secondary | ICD-10-CM | POA: Diagnosis not present

## 2022-12-08 DIAGNOSIS — M25512 Pain in left shoulder: Secondary | ICD-10-CM | POA: Diagnosis not present

## 2023-04-05 DIAGNOSIS — Z9181 History of falling: Secondary | ICD-10-CM | POA: Diagnosis not present

## 2023-04-05 DIAGNOSIS — E7800 Pure hypercholesterolemia, unspecified: Secondary | ICD-10-CM | POA: Diagnosis not present

## 2023-04-05 DIAGNOSIS — M25512 Pain in left shoulder: Secondary | ICD-10-CM | POA: Diagnosis not present

## 2023-04-05 DIAGNOSIS — E1169 Type 2 diabetes mellitus with other specified complication: Secondary | ICD-10-CM | POA: Diagnosis not present

## 2023-04-05 DIAGNOSIS — G8929 Other chronic pain: Secondary | ICD-10-CM | POA: Diagnosis not present

## 2023-04-05 DIAGNOSIS — E119 Type 2 diabetes mellitus without complications: Secondary | ICD-10-CM | POA: Diagnosis not present

## 2023-04-05 DIAGNOSIS — I10 Essential (primary) hypertension: Secondary | ICD-10-CM | POA: Diagnosis not present

## 2023-04-09 ENCOUNTER — Ambulatory Visit
Admission: RE | Admit: 2023-04-09 | Discharge: 2023-04-09 | Disposition: A | Discharge status: Home / Self Care | Payer: 59 | Source: Ambulatory Visit | Attending: Physician Assistant | Admitting: Physician Assistant

## 2023-04-09 ENCOUNTER — Other Ambulatory Visit: Payer: Self-pay | Admitting: Physician Assistant

## 2023-04-09 DIAGNOSIS — M25512 Pain in left shoulder: Secondary | ICD-10-CM

## 2023-04-09 DIAGNOSIS — I700 Atherosclerosis of aorta: Secondary | ICD-10-CM | POA: Diagnosis not present

## 2023-04-09 DIAGNOSIS — M19012 Primary osteoarthritis, left shoulder: Secondary | ICD-10-CM | POA: Diagnosis not present

## 2023-04-18 DIAGNOSIS — M7502 Adhesive capsulitis of left shoulder: Secondary | ICD-10-CM | POA: Diagnosis not present

## 2023-05-08 DIAGNOSIS — H401131 Primary open-angle glaucoma, bilateral, mild stage: Secondary | ICD-10-CM | POA: Diagnosis not present

## 2023-05-28 DIAGNOSIS — M25612 Stiffness of left shoulder, not elsewhere classified: Secondary | ICD-10-CM | POA: Diagnosis not present

## 2023-05-31 DIAGNOSIS — M25612 Stiffness of left shoulder, not elsewhere classified: Secondary | ICD-10-CM | POA: Diagnosis not present

## 2023-06-29 DIAGNOSIS — M25612 Stiffness of left shoulder, not elsewhere classified: Secondary | ICD-10-CM | POA: Diagnosis not present

## 2023-07-16 DIAGNOSIS — M25612 Stiffness of left shoulder, not elsewhere classified: Secondary | ICD-10-CM | POA: Diagnosis not present

## 2023-08-15 ENCOUNTER — Other Ambulatory Visit: Payer: Self-pay

## 2023-10-13 ENCOUNTER — Other Ambulatory Visit: Payer: Self-pay

## 2023-10-13 ENCOUNTER — Emergency Department (HOSPITAL_COMMUNITY): Payer: 59

## 2023-10-13 ENCOUNTER — Emergency Department (HOSPITAL_COMMUNITY)
Admission: EM | Admit: 2023-10-13 | Discharge: 2023-10-13 | Disposition: A | Discharge status: Home / Self Care | Payer: 59 | Attending: Emergency Medicine | Admitting: Emergency Medicine

## 2023-10-13 DIAGNOSIS — E119 Type 2 diabetes mellitus without complications: Secondary | ICD-10-CM | POA: Insufficient documentation

## 2023-10-13 DIAGNOSIS — F419 Anxiety disorder, unspecified: Secondary | ICD-10-CM | POA: Diagnosis not present

## 2023-10-13 DIAGNOSIS — R002 Palpitations: Secondary | ICD-10-CM | POA: Diagnosis present

## 2023-10-13 DIAGNOSIS — R072 Precordial pain: Secondary | ICD-10-CM | POA: Insufficient documentation

## 2023-10-13 DIAGNOSIS — I10 Essential (primary) hypertension: Secondary | ICD-10-CM | POA: Insufficient documentation

## 2023-10-13 DIAGNOSIS — Z7984 Long term (current) use of oral hypoglycemic drugs: Secondary | ICD-10-CM | POA: Diagnosis not present

## 2023-10-13 DIAGNOSIS — Z79899 Other long term (current) drug therapy: Secondary | ICD-10-CM | POA: Diagnosis not present

## 2023-10-13 LAB — BASIC METABOLIC PANEL
Anion gap: 16 — ABNORMAL HIGH (ref 5–15)
BUN: 13 mg/dL (ref 8–23)
CO2: 22 mmol/L (ref 22–32)
Calcium: 10 mg/dL (ref 8.9–10.3)
Chloride: 103 mmol/L (ref 98–111)
Creatinine, Ser: 0.62 mg/dL (ref 0.44–1.00)
GFR, Estimated: >60 mL/min (ref >60)
Glucose, Bld: 134 mg/dL — ABNORMAL HIGH (ref 70–99)
Potassium: 3.1 mmol/L — ABNORMAL LOW (ref 3.5–5.1)
Sodium: 141 mmol/L (ref 135–145)

## 2023-10-13 LAB — CBC
HCT: 35.9 % — ABNORMAL LOW (ref 36.0–46.0)
Hemoglobin: 12.2 g/dL (ref 12.0–15.0)
MCH: 28 pg (ref 26.0–34.0)
MCHC: 34 g/dL (ref 30.0–36.0)
MCV: 82.5 fL (ref 80.0–100.0)
Platelets: 233 10*3/uL (ref 150–400)
RBC: 4.35 MIL/uL (ref 3.87–5.11)
RDW: 13.5 % (ref 11.5–15.5)
WBC: 8.3 10*3/uL (ref 4.0–10.5)
nRBC: 0 % (ref 0.0–0.2)

## 2023-10-13 LAB — TROPONIN I (HIGH SENSITIVITY)
Troponin I (High Sensitivity): 7 ng/L (ref <18)
Troponin I (High Sensitivity): 9 ng/L (ref <18)

## 2023-10-13 MED ORDER — LORAZEPAM 1 MG PO TABS
1.0000 mg | ORAL_TABLET | Freq: Once | ORAL | Status: AC
  Administered 2023-10-13: 1 mg via ORAL
  Filled 2023-10-13: qty 1

## 2023-10-13 MED ORDER — POTASSIUM CHLORIDE CRYS ER 20 MEQ PO TBCR
40.0000 meq | EXTENDED_RELEASE_TABLET | Freq: Once | ORAL | Status: AC
  Administered 2023-10-13: 40 meq via ORAL
  Filled 2023-10-13: qty 2

## 2023-10-13 NOTE — ED Notes (Signed)
Pt reports feeling palpitations occasionally.

## 2023-10-13 NOTE — ED Triage Notes (Signed)
Left chest tightness/pressure with SOB onset yesterday , no emesis or diaphoresis .

## 2023-10-13 NOTE — ED Notes (Signed)
Pt unable to sit still for EKG.

## 2023-10-13 NOTE — ED Provider Notes (Signed)
[place] EMERGENCY DEPARTMENT AT [facility] Provider Note   CSN: [id number] Arrival date & time: 10/13/23  0230     History  Chief Complaint  Patient presents with   Chest Pain    Traci Morales is a 72 y.o. female.  The history is provided by the patient and a relative.  Patient with history of anxiety, hypertension presents with a funny feeling in her chest and palpitations.  Patient reports yesterday morning she noticed a funny feeling in the left side of her chest.  It was occasionally painful but no other associated symptoms.  At times it felt sharp but did not radiate.  No shortness of breath.  No nausea vomiting or diaphoresis.  After arriving to the ER she is felt more anxious and has had palpitations No active pain at this time.  No focal weakness. She has had this previously.   Past Medical History:  Diagnosis Date   Anxiety    Depression    Diabetes mellitus without complication (HCC)    Hyperlipemia    Hypertension    Nasal congestion 08/25/15   per patient she has environmental allergies   PONV (postoperative nausea and vomiting)    nausea only    Home Medications Prior to Admission medications   Medication Sig Start Date End Date Taking? Authorizing Provider  amLODipine (NORVASC) 10 MG tablet Take 10 mg by mouth daily.  08/17/15   [provider]  cetirizine (ZYRTEC) 5 MG tablet Take 1 tablet (5 mg total) by mouth daily. 10/12/18   Cathie Hoops, Amy V, PA-C  dorzolamide (TRUSOPT) 2 % ophthalmic solution Place 1 drop into both eyes daily. 08/09/15   [provider]  fluocinonide (LIDEX) 0.05 % external solution Apply 1 application topically once a week. 07/08/15   [provider]  FLUoxetine (PROZAC) 20 MG capsule Take 1 capsule by mouth daily. 08/19/15   [provider]  glimepiride (AMARYL) 4 MG tablet Take 2 mg by mouth daily. 08/19/15   [provider]  ipratropium (ATROVENT) 0.06 % nasal spray Place 2 sprays  into both nostrils 4 (four) times daily. 10/12/18   Cathie Hoops, Amy V, PA-C  JANUVIA 100 MG tablet Take 1 tablet by mouth daily. 07/29/15   [provider]  lidocaine (XYLOCAINE) 2 % solution 5-15 mL gurgle as needed 10/12/18   Cathie Hoops, Amy V, PA-C  lisinopril-hydrochlorothiazide (PRINZIDE,ZESTORETIC) 20-25 MG per tablet Take 1 tablet by mouth daily. 07/21/15   [provider]  LORazepam (ATIVAN) 1 MG tablet Take 1 mg by mouth at bedtime. 07/30/15   [provider]  metFORMIN (GLUCOPHAGE-XR) 500 MG 24 hr tablet Take 4 tablets by mouth daily. Pt prefers to take all 4 tablets at same time 08/02/15   [provider]  simvastatin (ZOCOR) 20 MG tablet Take 1 tablet by mouth daily. 07/29/15   [provider]  spironolactone (ALDACTONE) 25 MG tablet Take 1 tablet by mouth daily. 08/19/15   [provider]  TRAVATAN Z 0.004 % SOLN ophthalmic solution Place 1 drop into both eyes at bedtime. 08/09/15   [provider]      Allergies    Patient has no known allergies.    Review of Systems   Review of Systems  Constitutional:  Negative for fever.  Cardiovascular:  Positive for palpitations.  Psychiatric/Behavioral:  The patient is nervous/anxious.     Physical Exam Updated Vital Signs BP (!) 128/51   Pulse 88   Temp 97.6 F (36.4  C) (Oral)   Resp (!) 28   SpO2 100%  Physical Exam CONSTITUTIONAL: Elderly, anxious HEAD: Normocephalic/atraumatic EYES: EOMI/PERRL ENMT: Mucous membranes moist NECK: supple no meningeal signs CV: S1/S2 noted, no murmurs/rubs/gallops noted LUNGS: Lungs are clear to auscultation bilaterally, no apparent distress ABDOMEN: soft, nontender NEURO: Pt is awake/alert/appropriate, moves all extremitiesx4.  No facial droop.   EXTREMITIES: pulses normal/equalx4, full ROM SKIN: warm, color normal PSYCH: Anxious  ED Results / Procedures / Treatments   Labs (all labs ordered are listed, but only abnormal results are  displayed) Labs Reviewed  BASIC METABOLIC PANEL - Abnormal; Notable for the following components:      Result Value   Potassium 3.1 (*)    Glucose, Bld 134 (*)    Anion gap 16 (*)    All other components within normal limits  CBC - Abnormal; Notable for the following components:   HCT 35.9 (*)    All other components within normal limits  TROPONIN I (HIGH SENSITIVITY)  TROPONIN I (HIGH SENSITIVITY)    EKG EKG Interpretation Date/Time:  Saturday October 13 2023 05:50:46 EDT Ventricular Rate:  90 PR Interval:  129 QRS Duration:  93 QT Interval:  374 QTC Calculation: 458 R Axis:   -4  Text Interpretation: Sinus rhythm Abnormal R-wave progression, early transition Confirmed by Zadie Rhine ([id number]) on 10/13/2023 5:54:44 AM  Radiology DG Chest 2 View  Result Date: 10/13/2023 CLINICAL DATA:  Chest tightness, shortness of breath. EXAM: CHEST - 2 VIEW COMPARISON:  12/28/2006. FINDINGS: The heart size and mediastinal contours are within normal limits. There is atherosclerotic calcification of the aorta. No consolidation, effusion, or pneumothorax. Degenerative changes are present in the thoracic spine. No acute osseous abnormality. IMPRESSION: No active cardiopulmonary disease. Electronically Signed   By: Thornell Sartorius M.D.   On: 10/13/2023 03:41    Procedures Procedures    Medications Ordered in ED Medications  LORazepam (ATIVAN) tablet 1 mg (1 mg Oral Given 10/13/23 0427)  potassium chloride SA (KLOR-CON M) CR tablet 40 mEq (40 mEq Oral Given 10/13/23 0427)    ED Course/ Medical Decision Making/ A&P Clinical Course as of 10/13/23 0700  Sat Oct 13, 2023  0358 By her own admission, patient feels most of this is from anxiety.  She reports her anxiety worsened after I entered the room.  She denies any chest pain at this time. Will treat her anxiety and reassess [DW]  0553 Overall patient is improving.  Awaiting final labs [DW]  [phone] Workup in the Emergency Department is  unremarkable.  Patient feels improved.  Patient reports that she feels is due to her anxiety.  She reports very little sleep over the past week.  She reports she has the world on her shoulders.  She denies SI and feels safe at home.  She is interested in seeking counseling.  She has been given Ativan before but has no counseling support.  She has been given resources to the behavioral [facility] Patient feels safe for discharge [DW]    Clinical Course User Index [DW] Zadie Rhine, MD                                 Medical Decision Making Amount and/or Complexity of Data Reviewed Labs: ordered. Radiology: ordered. ECG/medicine tests: ordered.  Risk Prescription drug management.   This patient presents to the ED for concern of palpitations, this involves an extensive number of treatment options,  and is a complaint that carries with it a high risk of complications and morbidity.  The differential diagnosis includes but is not limited to anxiety, SVT, sinus tachycardia, atrial flutter, atrial fibrillation, ventricular tachycardia  Comorbidities that complicate the patient evaluation: Patient's presentation is complicated by their history of diabetes and hypertension  Social Determinants of Health: Patient's  anxiety   increases the complexity of managing their presentation  Additional history obtained: Additional history obtained from family  Lab Tests: I Ordered, and personally interpreted labs.  The pertinent results include: Hypokalemia  Imaging Studies ordered: I ordered imaging studies including X-ray chest   I independently visualized and interpreted imaging which showed no acute findings I agree with the radiologist interpretation  Cardiac Monitoring: The patient was maintained on a cardiac monitor.  I personally viewed and interpreted the cardiac monitor which showed an underlying rhythm of:  sinus rhythm  Reevaluation: After the interventions noted above, I  reevaluated the patient and found that they have :improved  Complexity of problems addressed: Patient's presentation is most consistent with  acute presentation with potential threat to life or bodily function  Disposition: After consideration of the diagnostic results and the patient's response to treatment,  I feel that the patent would benefit from discharge   .           Final Clinical Impression(s) / ED Diagnoses Final diagnoses:  Precordial pain  Anxiety    Rx / DC Orders ED Discharge Orders     None         Zadie Rhine, MD 10/13/23 [phone]

## 2023-10-13 NOTE — Discharge Instructions (Signed)

## 2023-10-13 NOTE — ED Notes (Signed)
Pt coming from X-ray.

## 2023-11-06 DIAGNOSIS — H401131 Primary open-angle glaucoma, bilateral, mild stage: Secondary | ICD-10-CM | POA: Diagnosis not present

## 2023-11-26 DIAGNOSIS — Z0000 Encounter for general adult medical examination without abnormal findings: Secondary | ICD-10-CM | POA: Diagnosis not present

## 2023-11-26 DIAGNOSIS — I10 Essential (primary) hypertension: Secondary | ICD-10-CM | POA: Diagnosis not present

## 2023-11-26 DIAGNOSIS — E1169 Type 2 diabetes mellitus with other specified complication: Secondary | ICD-10-CM | POA: Diagnosis not present

## 2023-11-26 DIAGNOSIS — H409 Unspecified glaucoma: Secondary | ICD-10-CM | POA: Diagnosis not present

## 2023-11-26 DIAGNOSIS — M810 Age-related osteoporosis without current pathological fracture: Secondary | ICD-10-CM | POA: Diagnosis not present

## 2023-11-26 DIAGNOSIS — Z1211 Encounter for screening for malignant neoplasm of colon: Secondary | ICD-10-CM | POA: Diagnosis not present

## 2023-11-26 DIAGNOSIS — E7800 Pure hypercholesterolemia, unspecified: Secondary | ICD-10-CM | POA: Diagnosis not present

## 2023-11-26 DIAGNOSIS — E1165 Type 2 diabetes mellitus with hyperglycemia: Secondary | ICD-10-CM | POA: Diagnosis not present

## 2023-11-27 DIAGNOSIS — Z9189 Other specified personal risk factors, not elsewhere classified: Secondary | ICD-10-CM | POA: Diagnosis not present

## 2023-12-03 DIAGNOSIS — Z1231 Encounter for screening mammogram for malignant neoplasm of breast: Secondary | ICD-10-CM | POA: Diagnosis not present

## 2023-12-05 DIAGNOSIS — Z1211 Encounter for screening for malignant neoplasm of colon: Secondary | ICD-10-CM | POA: Diagnosis not present

## 2024-01-27 DIAGNOSIS — K2900 Acute gastritis without bleeding: Secondary | ICD-10-CM | POA: Diagnosis not present

## 2024-01-27 DIAGNOSIS — R001 Bradycardia, unspecified: Secondary | ICD-10-CM | POA: Diagnosis not present

## 2024-01-27 DIAGNOSIS — R0789 Other chest pain: Secondary | ICD-10-CM | POA: Diagnosis not present

## 2024-03-31 DIAGNOSIS — R0602 Shortness of breath: Secondary | ICD-10-CM | POA: Diagnosis not present

## 2024-04-09 DIAGNOSIS — I10 Essential (primary) hypertension: Secondary | ICD-10-CM | POA: Diagnosis not present

## 2024-04-09 DIAGNOSIS — E1169 Type 2 diabetes mellitus with other specified complication: Secondary | ICD-10-CM | POA: Diagnosis not present

## 2024-04-09 DIAGNOSIS — E7800 Pure hypercholesterolemia, unspecified: Secondary | ICD-10-CM | POA: Diagnosis not present

## 2024-04-16 ENCOUNTER — Emergency Department (HOSPITAL_COMMUNITY)
Admission: EM | Admit: 2024-04-16 | Discharge: 2024-04-16 | Disposition: A | Discharge status: Home / Self Care | Attending: Emergency Medicine | Admitting: Emergency Medicine

## 2024-04-16 ENCOUNTER — Encounter (HOSPITAL_COMMUNITY): Payer: Self-pay

## 2024-04-16 ENCOUNTER — Emergency Department (HOSPITAL_COMMUNITY)

## 2024-04-16 ENCOUNTER — Other Ambulatory Visit: Payer: Self-pay

## 2024-04-16 DIAGNOSIS — R1110 Vomiting, unspecified: Secondary | ICD-10-CM | POA: Diagnosis not present

## 2024-04-16 DIAGNOSIS — Z79899 Other long term (current) drug therapy: Secondary | ICD-10-CM | POA: Diagnosis not present

## 2024-04-16 DIAGNOSIS — J111 Influenza due to unidentified influenza virus with other respiratory manifestations: Secondary | ICD-10-CM

## 2024-04-16 DIAGNOSIS — E119 Type 2 diabetes mellitus without complications: Secondary | ICD-10-CM | POA: Insufficient documentation

## 2024-04-16 DIAGNOSIS — R112 Nausea with vomiting, unspecified: Secondary | ICD-10-CM

## 2024-04-16 DIAGNOSIS — Z7984 Long term (current) use of oral hypoglycemic drugs: Secondary | ICD-10-CM | POA: Insufficient documentation

## 2024-04-16 DIAGNOSIS — I517 Cardiomegaly: Secondary | ICD-10-CM | POA: Diagnosis not present

## 2024-04-16 DIAGNOSIS — J101 Influenza due to other identified influenza virus with other respiratory manifestations: Secondary | ICD-10-CM | POA: Insufficient documentation

## 2024-04-16 DIAGNOSIS — I10 Essential (primary) hypertension: Secondary | ICD-10-CM | POA: Insufficient documentation

## 2024-04-16 DIAGNOSIS — R059 Cough, unspecified: Secondary | ICD-10-CM | POA: Diagnosis not present

## 2024-04-16 LAB — URINALYSIS, ROUTINE W REFLEX MICROSCOPIC
Bilirubin Urine: NEGATIVE
Glucose, UA: NEGATIVE mg/dL
Hgb urine dipstick: NEGATIVE
Ketones, ur: 5 mg/dL — AB
Nitrite: NEGATIVE
Protein, ur: 30 mg/dL — AB
Specific Gravity, Urine: 1.018 (ref 1.005–1.030)
pH: 5 (ref 5.0–8.0)

## 2024-04-16 LAB — COMPREHENSIVE METABOLIC PANEL WITH GFR
ALT: 18 U/L (ref 0–44)
AST: 25 U/L (ref 15–41)
Albumin: 3.1 g/dL — ABNORMAL LOW (ref 3.5–5.0)
Alkaline Phosphatase: 76 U/L (ref 38–126)
Anion gap: 13 (ref 5–15)
BUN: 12 mg/dL (ref 8–23)
CO2: 22 mmol/L (ref 22–32)
Calcium: 8.9 mg/dL (ref 8.9–10.3)
Chloride: 100 mmol/L (ref 98–111)
Creatinine, Ser: 1.01 mg/dL — ABNORMAL HIGH (ref 0.44–1.00)
GFR, Estimated: 58 mL/min — ABNORMAL LOW (ref >60)
Glucose, Bld: 144 mg/dL — ABNORMAL HIGH (ref 70–99)
Potassium: 3.4 mmol/L — ABNORMAL LOW (ref 3.5–5.1)
Sodium: 135 mmol/L (ref 135–145)
Total Bilirubin: 0.7 mg/dL (ref 0.0–1.2)
Total Protein: 7 g/dL (ref 6.5–8.1)

## 2024-04-16 LAB — RESP PANEL BY RT-PCR (RSV, FLU A&B, COVID)  RVPGX2
Influenza A by PCR: POSITIVE — AB
Influenza B by PCR: NEGATIVE
Resp Syncytial Virus by PCR: NEGATIVE
SARS Coronavirus 2 by RT PCR: NEGATIVE

## 2024-04-16 LAB — CBC
HCT: 39.6 % (ref 36.0–46.0)
Hemoglobin: 13.2 g/dL (ref 12.0–15.0)
MCH: 27.6 pg (ref 26.0–34.0)
MCHC: 33.3 g/dL (ref 30.0–36.0)
MCV: 82.8 fL (ref 80.0–100.0)
Platelets: 188 10*3/uL (ref 150–400)
RBC: 4.78 MIL/uL (ref 3.87–5.11)
RDW: 13.7 % (ref 11.5–15.5)
WBC: 4.3 10*3/uL (ref 4.0–10.5)
nRBC: 0 % (ref 0.0–0.2)

## 2024-04-16 LAB — LIPASE, BLOOD: Lipase: 30 U/L (ref 11–51)

## 2024-04-16 MED ORDER — ONDANSETRON 4 MG PO TBDP
4.0000 mg | ORAL_TABLET | Freq: Once | ORAL | Status: AC | PRN
  Administered 2024-04-16: 4 mg via ORAL
  Filled 2024-04-16: qty 1

## 2024-04-16 MED ORDER — SODIUM CHLORIDE 0.9 % IV BOLUS
500.0000 mL | Freq: Once | INTRAVENOUS | Status: AC
  Administered 2024-04-16: 500 mL via INTRAVENOUS

## 2024-04-16 NOTE — ED Notes (Signed)
 PO challenge passed

## 2024-04-16 NOTE — ED Triage Notes (Signed)
 Patient c/o n/v x 3-4 days, unable to keep meals down but small bites will stay down. Patient reports chills/fever, afebrile at this time. Reports BM normal for her, no abd pain.

## 2024-04-16 NOTE — Discharge Instructions (Signed)
Follow up with your primary care doctor if your symptoms are improving.  Return to the emergency room if you have any worsening symptoms.

## 2024-04-16 NOTE — ED Notes (Signed)
 Pt ambulated to the restroom with no apparent distress.

## 2024-04-16 NOTE — ED Provider Notes (Signed)
 [place] EMERGENCY DEPARTMENT AT [facility] Provider Note   CSN: [id number] Arrival date & time: 04/16/24  1410     History  Chief Complaint  Patient presents with   Emesis   Nausea    Sumedha Katianne Fencl is a 73 y.o. female.  Patient is a 73 year old female with a history of diabetes, hypertension, hyperlipidemia who presents with nausea and cough.  She is here with her family member who also helps provide history.  Both of them had a viral type illness that started about a week ago.  They had sore throat, congestion and coughing.  Those symptoms have improved although the patient still has a lot of coughing.  She is coughing up some white clear sputum.  She has had some nausea and gagging as well.  She has not had a lot of intense vomiting.  She denies any abdominal pain.  No diarrhea or change in her stools.  She does have chronic constipation.  She denies any known fevers.  No urinary symptoms.  She came in today because she still has an ongoing cough and she feels generally fatigued.       Home Medications Prior to Admission medications   Medication Sig Start Date End Date Taking? Authorizing Provider  amLODipine (NORVASC) 10 MG tablet Take 10 mg by mouth daily.  08/17/15   [provider]  cetirizine  (ZYRTEC ) 5 MG tablet Take 1 tablet (5 mg total) by mouth daily. 10/12/18   Wilhelmenia Harada, Amy V, PA-C  dorzolamide (TRUSOPT) 2 % ophthalmic solution Place 1 drop into both eyes daily. 08/09/15   [provider]  fluocinonide (LIDEX) 0.05 % external solution Apply 1 application topically once a week. 07/08/15   [provider]  FLUoxetine (PROZAC) 20 MG capsule Take 1 capsule by mouth daily. 08/19/15   [provider]  glimepiride (AMARYL) 4 MG tablet Take 2 mg by mouth daily. 08/19/15   [provider]  ipratropium (ATROVENT ) 0.06 % nasal spray Place 2 sprays into both nostrils 4 (four) times daily. 10/12/18   Wilhelmenia Harada, Amy V, PA-C  JANUVIA 100  MG tablet Take 1 tablet by mouth daily. 07/29/15   [provider]  lidocaine  (XYLOCAINE ) 2 % solution 5-15 mL gurgle as needed 10/12/18   Wilhelmenia Harada, Amy V, PA-C  lisinopril-hydrochlorothiazide (PRINZIDE,ZESTORETIC) 20-25 MG per tablet Take 1 tablet by mouth daily. 07/21/15   [provider]  LORazepam  (ATIVAN ) 1 MG tablet Take 1 mg by mouth at bedtime. 07/30/15   [provider]  metFORMIN (GLUCOPHAGE-XR) 500 MG 24 hr tablet Take 4 tablets by mouth daily. Pt prefers to take all 4 tablets at same time 08/02/15   [provider]  simvastatin (ZOCOR) 20 MG tablet Take 1 tablet by mouth daily. 07/29/15   [provider]  spironolactone (ALDACTONE) 25 MG tablet Take 1 tablet by mouth daily. 08/19/15   [provider]  TRAVATAN Z 0.004 % SOLN ophthalmic solution Place 1 drop into both eyes at bedtime. 08/09/15   [provider]      Allergies    Patient has no known allergies.    Review of Systems   Review of Systems  Constitutional:  Positive for fatigue. Negative for chills, diaphoresis and fever.  HENT:  Positive for congestion and rhinorrhea. Negative for sneezing.   Eyes: Negative.   Respiratory:  Positive for cough. Negative for chest tightness and shortness of breath.   Cardiovascular:  Negative for chest pain and leg swelling.  Gastrointestinal:  Positive for nausea and vomiting. Negative for abdominal pain, blood in stool and diarrhea.  Genitourinary:  Negative for difficulty urinating, flank pain, frequency and hematuria.  Musculoskeletal:  Negative for arthralgias and back pain.  Skin:  Negative for rash.  Neurological:  Negative for dizziness, speech difficulty, weakness, numbness and headaches.    Physical Exam Updated Vital Signs BP (!) 97/53   Pulse 73   Temp 98.5 F (36.9 C) (Oral)   Resp (!) 22   Ht 5\' 5"  (1.651 m)   Wt 79.8 kg   SpO2 100%   BMI 29.29 kg/m  Physical Exam Constitutional:      Appearance: She is  well-developed.  HENT:     Head: Normocephalic and atraumatic.     Mouth/Throat:     Mouth: Mucous membranes are moist.     Pharynx: No oropharyngeal exudate or posterior oropharyngeal erythema.  Eyes:     Pupils: Pupils are equal, round, and reactive to light.  Cardiovascular:     Rate and Rhythm: Normal rate and regular rhythm.     Heart sounds: Normal heart sounds.  Pulmonary:     Effort: Pulmonary effort is normal. No respiratory distress.     Breath sounds: Normal breath sounds. No wheezing or rales.  Chest:     Chest wall: No tenderness.  Abdominal:     General: Bowel sounds are normal.     Palpations: Abdomen is soft.     Tenderness: There is no abdominal tenderness. There is no guarding or rebound.  Musculoskeletal:        General: Normal range of motion.     Cervical back: Normal range of motion and neck supple.  Lymphadenopathy:     Cervical: No cervical adenopathy.  Skin:    General: Skin is warm and dry.     Findings: No rash.  Neurological:     Mental Status: She is alert and oriented to person, place, and time.     ED Results / Procedures / Treatments   Labs (all labs ordered are listed, but only abnormal results are displayed) Labs Reviewed  RESP PANEL BY RT-PCR (RSV, FLU A&B, COVID)  [id number] - Abnormal; Notable for the following components:      Result Value   Influenza A by PCR POSITIVE (*)    All other components within normal limits  COMPREHENSIVE METABOLIC PANEL WITH GFR - Abnormal; Notable for the following components:   Potassium 3.4 (*)    Glucose, Bld 144 (*)    Creatinine, Ser 1.01 (*)    Albumin 3.1 (*)    GFR, Estimated 58 (*)    All other components within normal limits  URINALYSIS, ROUTINE W REFLEX MICROSCOPIC - Abnormal; Notable for the following components:   Color, Urine AMBER (*)    APPearance CLOUDY (*)    Ketones, ur 5 (*)    Protein, ur 30 (*)    Leukocytes,Ua TRACE (*)    Bacteria, UA RARE (*)    All other components within  normal limits  URINE CULTURE  LIPASE, BLOOD  CBC    EKG None  Radiology DG Abdomen Acute W/Chest Result Date: 04/16/2024 CLINICAL DATA:  Cough and vomiting. EXAM: DG ABDOMEN ACUTE WITH 1 VIEW CHEST COMPARISON:  Chest radiograph dated 10/13/2023. FINDINGS: No focal consolidation, pleural effusion or pneumothorax. Mild cardiomegaly. No bowel dilatation or evidence of obstruction. No free air a calculi. Amorphous calcification over the pelvis likely uterine fibroids. No acute osseous pathology. IMPRESSION: 1. No  acute cardiopulmonary process. 2. Nonobstructive bowel gas pattern. Electronically Signed   By: Angus Bark M.D.   On: 04/16/2024 17:57    Procedures Procedures    Medications Ordered in ED Medications  ondansetron  (ZOFRAN -ODT) disintegrating tablet 4 mg (4 mg Oral Given 04/16/24 1550)  sodium chloride  0.9 % bolus 500 mL (0 mLs Intravenous Stopped 04/16/24 1940)  sodium chloride  0.9 % bolus 500 mL (0 mLs Intravenous Stopped 04/16/24 2233)    ED Course/ Medical Decision Making/ A&P                                 Medical Decision Making Amount and/or Complexity of Data Reviewed Labs: ordered. Radiology: ordered.  Risk Prescription drug management.   Patient is a 73 year old who presents with URI symptoms that started about a week ago and now with some nausea and decreased appetite and generalized weakness.  She does not have associated abdominal pain.  Chest x-ray was performed which shows no evidence of pneumonia or pulmonary edema.  Acute abdominal series does not show any evidence of obstruction.  These were interpreted by me and confirmed by the radiologist.  Labs show mild elevation in glucose without suggestions of DKA.  She is a known diabetic.  Her influenza test is positive which is likely the etiology of her symptoms.  Her blood pressures been little bit on the low side in the low 100s and upper 90s.  She was given IV fluids and remained about the same.  She was  able to ambulate without symptoms.  I ordered more IV fluids.  Her current pressure is 97/53.  However she is refusing and says that she is ready to go.  She does not want to stay for any further treatment.  She is not febrile.  She does not have any other clinical concerns for sepsis.  Her urine had some trace leukocyte esterase but appears to be a dirty specimen.  She does not have urinary symptoms.  Will send for culture, but will hold off treatment for now.  She was discharged home in good condition.  Return precautions were given.  Final Clinical Impression(s) / ED Diagnoses Final diagnoses:  Influenza  Nausea and vomiting, unspecified vomiting type    Rx / DC Orders ED Discharge Orders     None         Hershel Los, MD 04/16/24 2257

## 2024-04-18 LAB — URINE CULTURE

## 2024-05-26 DIAGNOSIS — H401131 Primary open-angle glaucoma, bilateral, mild stage: Secondary | ICD-10-CM | POA: Diagnosis not present

## 2024-06-26 DIAGNOSIS — R9431 Abnormal electrocardiogram [ECG] [EKG]: Secondary | ICD-10-CM | POA: Diagnosis not present

## 2024-07-30 DIAGNOSIS — E1169 Type 2 diabetes mellitus with other specified complication: Secondary | ICD-10-CM | POA: Diagnosis not present

## 2024-08-20 ENCOUNTER — Emergency Department (HOSPITAL_COMMUNITY): Admission: EM | Admit: 2024-08-20 | Discharge: 2024-08-20 | Disposition: A | Discharge status: Home / Self Care

## 2024-08-20 ENCOUNTER — Encounter (HOSPITAL_COMMUNITY): Payer: Self-pay | Admitting: *Deleted

## 2024-08-20 ENCOUNTER — Other Ambulatory Visit: Payer: Self-pay

## 2024-08-20 DIAGNOSIS — H1131 Conjunctival hemorrhage, right eye: Secondary | ICD-10-CM | POA: Insufficient documentation

## 2024-08-20 DIAGNOSIS — H5711 Ocular pain, right eye: Secondary | ICD-10-CM | POA: Diagnosis present

## 2024-08-20 MED ORDER — POLYMYXIN B-TRIMETHOPRIM 10000-0.1 UNIT/ML-% OP SOLN: 2.0000 [drp] | Freq: Four times a day (QID) | OPHTHALMIC | 0 refills | Status: DC

## 2024-08-20 MED ORDER — POLYMYXIN B-TRIMETHOPRIM 10000-0.1 UNIT/ML-% OP SOLN: 2.0000 [drp] | Freq: Four times a day (QID) | OPHTHALMIC | 0 refills | Status: AC

## 2024-08-20 NOTE — ED Triage Notes (Signed)
 Pt here for eye pain on right eye, red on assessment. Denies vision issues. Pt states she rubbed her eye and it turned red.

## 2024-08-20 NOTE — ED Provider Notes (Signed)
  EMERGENCY DEPARTMENT AT [facility] Provider Note   CSN: [id number] Arrival date & time: 08/20/24  1722     Patient presents with: eye swelling   Traci Morales is a 73 y.o. female who presents to the ED today with primary concern of redness on the right eye after prolonged rubbing, concerned she may have scratched her eye.  She does not have any pain in the eye, denies having any sensation of foreign object, though is concerned about the bright red appearance of the right side of her right eye.   HPI     Prior to Admission medications   Medication Sig Start Date End Date Taking? Authorizing Provider  trimethoprim -polymyxin b  (POLYTRIM ) ophthalmic solution Place 2 drops into the right eye every 6 (six) hours. 08/20/24  Yes Myriam Dorn BROCKS, PA  amLODipine (NORVASC) 10 MG tablet Take 10 mg by mouth daily.  08/17/15   [provider]  cetirizine  (ZYRTEC ) 5 MG tablet Take 1 tablet (5 mg total) by mouth daily. 10/12/18   Babara, Amy V, PA-C  dorzolamide (TRUSOPT) 2 % ophthalmic solution Place 1 drop into both eyes daily. 08/09/15   [provider]  fluocinonide (LIDEX) 0.05 % external solution Apply 1 application topically once a week. 07/08/15   [provider]  FLUoxetine (PROZAC) 20 MG capsule Take 1 capsule by mouth daily. 08/19/15   [provider]  glimepiride (AMARYL) 4 MG tablet Take 2 mg by mouth daily. 08/19/15   [provider]  ipratropium (ATROVENT ) 0.06 % nasal spray Place 2 sprays into both nostrils 4 (four) times daily. 10/12/18   Babara, Amy V, PA-C  JANUVIA 100 MG tablet Take 1 tablet by mouth daily. 07/29/15   [provider]  lidocaine  (XYLOCAINE ) 2 % solution 5-15 mL gurgle as needed 10/12/18   Babara, Amy V, PA-C  lisinopril-hydrochlorothiazide (PRINZIDE,ZESTORETIC) 20-25 MG per tablet Take 1 tablet by mouth daily. 07/21/15   [provider]  LORazepam  (ATIVAN ) 1 MG tablet Take 1 mg by mouth at  bedtime. 07/30/15   [provider]  metFORMIN (GLUCOPHAGE-XR) 500 MG 24 hr tablet Take 4 tablets by mouth daily. Pt prefers to take all 4 tablets at same time 08/02/15   [provider]  simvastatin (ZOCOR) 20 MG tablet Take 1 tablet by mouth daily. 07/29/15   [provider]  spironolactone (ALDACTONE) 25 MG tablet Take 1 tablet by mouth daily. 08/19/15   [provider]  TRAVATAN Z 0.004 % SOLN ophthalmic solution Place 1 drop into both eyes at bedtime. 08/09/15   [provider]    Allergies: Patient has no known allergies.    Review of Systems  Eyes:  Positive for redness.  All other systems reviewed and are negative.   Updated Vital Signs BP (!) 124/56 (BP Location: Right Arm)   Pulse 77   Temp 99 F (37.2 C) (Oral)   Resp 16   Ht 5' 5 (1.651 m)   Wt 79.8 kg   SpO2 96%   BMI 29.28 kg/m   Physical Exam Vitals and nursing note reviewed.  Constitutional:      General: She is not in acute distress.    Appearance: She is well-developed.  HENT:     Head: Normocephalic and atraumatic.  Eyes:     General: Lids are normal. Lids are everted, no foreign bodies appreciated. Vision grossly intact. Gaze aligned appropriately.     Extraocular Movements: Extraocular movements intact.  Conjunctiva/sclera:     Left eye: Hemorrhage present.      Comments: Subconjunctival hemorrhage of appreciated to the lateral side of the right eye.  Cardiovascular:     Rate and Rhythm: Normal rate and regular rhythm.     Heart sounds: No murmur heard. Pulmonary:     Effort: Pulmonary effort is normal. No respiratory distress.     Breath sounds: Normal breath sounds.  Abdominal:     Palpations: Abdomen is soft.     Tenderness: There is no abdominal tenderness.  Musculoskeletal:        General: No swelling.     Cervical back: Neck supple.  Skin:    General: Skin is warm and dry.     Capillary Refill: Capillary refill takes less than 2 seconds.   Neurological:     Mental Status: She is alert.  Psychiatric:        Mood and Affect: Mood normal.     (all labs ordered are listed, but only abnormal results are displayed) Labs Reviewed - No data to display  EKG: None  Radiology: No results found.   Procedures   Medications Ordered in the ED - No data to display                                  Medical Decision Making  Medical Decision Making:   Traci Morales is a 73 y.o. female who presented to the ED today with right eye redness detailed above.     Complete initial physical exam performed, notably the patient  was alert oriented in no apparent distress.  She has a subconjunctival hemorrhage to the lateral aspect of the right eye, no apparent foreign objects..    Reviewed and confirmed nursing documentation for past medical history, family history, social history.    Initial Assessment:   With the patient's presentation of eye redness, most likely diagnosis is subconjunctival hemorrhage.  Further considered possible corneal abrasion.  Initial Plan:  Secondary to physical exam, defer any further lab or imaging workup at this time. Will empirically treat for corneal abrasion, as she does not have pain or foreign body sensation will defer fluorescein stain at this time. Objective evaluation as below reviewed    Reassessment and Plan:   Based on assessment, she has an apparent subconjunctival hemorrhage to the right eye.  Will empirically treat for corneal abrasion given the history of the injury, despite not having any pain or foreign body sensation.  Plan was explained to the patient, she understands and agrees has no further concerns at this time.       Final diagnoses:  Subconjunctival hemorrhage of right eye    ED Discharge Orders          Ordered    trimethoprim -polymyxin b  (POLYTRIM ) ophthalmic solution  Every 6 hours        08/20/24 1925               Myriam Dorn BROCKS, [place] 08/20/24  1929    Elnor Bernarda SQUIBB, DO 08/20/24 2350

## 2024-10-29 NOTE — Progress Notes (Signed)
 Traci Morales                                          MRN: [id number]   10/29/2024   The [facility] Quality Team Specialist reviewed this patient medical record for the purposes of chart review for care gap closure. The following were reviewed: chart review for care gap closure-kidney health evaluation for diabetes:uACR.    [facility] Quality Team
